# Patient Record
Sex: Male | Born: 1945
Health system: Southern US, Community
[De-identification: ages and names within clinical notes are randomized; demographics above are authoritative.]

## PROBLEM LIST (undated history)

## (undated) DIAGNOSIS — E785 Hyperlipidemia, unspecified: Secondary | ICD-10-CM

## (undated) DIAGNOSIS — E119 Type 2 diabetes mellitus without complications: Secondary | ICD-10-CM

## (undated) DIAGNOSIS — K649 Unspecified hemorrhoids: Secondary | ICD-10-CM

## (undated) HISTORY — DX: Hyperlipidemia, unspecified: E78.5

## (undated) HISTORY — PX: STOMACH SURGERY: SHX791

## (undated) HISTORY — DX: Type 2 diabetes mellitus without complications: E11.9

## (undated) HISTORY — DX: Unspecified hemorrhoids: K64.9

---

## 2012-11-13 ENCOUNTER — Encounter: Payer: Self-pay | Admitting: General Practice

## 2012-11-13 ENCOUNTER — Ambulatory Visit (INDEPENDENT_AMBULATORY_CARE_PROVIDER_SITE_OTHER): Payer: 59 | Admitting: General Practice

## 2012-11-13 ENCOUNTER — Encounter (INDEPENDENT_AMBULATORY_CARE_PROVIDER_SITE_OTHER): Payer: Self-pay

## 2012-11-13 VITALS — BP 112/72 | HR 73 | Temp 98.7°F | Ht 63.0 in | Wt 180.0 lb

## 2012-11-13 DIAGNOSIS — S99922A Unspecified injury of left foot, initial encounter: Secondary | ICD-10-CM

## 2012-11-13 DIAGNOSIS — Z23 Encounter for immunization: Secondary | ICD-10-CM

## 2012-11-13 DIAGNOSIS — S8990XA Unspecified injury of unspecified lower leg, initial encounter: Secondary | ICD-10-CM

## 2012-11-13 MED ORDER — AMOXICILLIN 500 MG PO CAPS: 500.0000 mg | ORAL_CAPSULE | Freq: Two times a day (BID) | ORAL | Status: DC

## 2012-11-13 NOTE — Patient Instructions (Signed)
Tetanus and Diphtheria Vaccine Your caregiver has suggested that you receive an immunization to prevent tetanus (lockjaw) and diphtheria. Tetanus and diphtheria are serious and deadly infectious diseases of the past that have been nearly wiped out by modern immunizations. Td or DT vaccines (shots) are the immunizations given to help prevent these illnesses. Td is the medical term for a standard tetanus dose, small diphtheria dose. DT means both in standard doses. ABOUT THE DISEASES Tetanus (lockjaw) and diphtheria are serious diseases. Tetanus is caused by a germ that lives in the soil. It enters the body through a cut or wound, often caused by a nail or broken piece of glass. You cannot catch tetanus from another person. Diphtheria spreads when germs pass from an infected person to the nose or throat of others. Tetanus causes serious, painful spasms of all muscles. It can lead to:  "Locking" of the muscles of the jaw and throat, so the patient cannot open his or her mouth or swallow.  Damage to the heart muscle. Diphtheria causes a thick coating in the nose, throat, or airway. It can lead to:  Breathing problems.  Kidney problems.  Heart failure.  Paralysis.  Death. ABOUT THE VACCINES  A vaccine is a shot (immunization) that can help prevent a disease. Vaccines have helped lower the rates of getting certain diseases. If people stopped getting vaccinated, more people would develop illnesses. These vaccines can be used in three ways:  As catch-up for people who did not get all their doses when they were children.  As a booster dose every 10 years.  For protection against tetanus infection, after a wound. Benefits of the vaccines Vaccination is the best way to protect against tetanus and diphtheria. Because of vaccination, there are fewer cases of these diseases. Cases are rare in children because most get a routine vaccination with DTP (Diphtheria, Tetanus, and Pertussis), DTaP  (Diphtheria, Tetanus, and acellular Pertussis), or DT (Diphtheria and Tetanus) vaccines. There would be many more cases if we stopped vaccinating people. Tetanus kills about 1 in 5 people who are infected. WHEN SHOULD YOU GET TD VACCINE?  Td is made for people 7 years of age and older.  People who have not gotten at least 3 doses of any tetanus and diphtheria vaccine (DTP, DTaP or DT) during their lifetime should do so using Td. After a person gets the third dose, a Td dose is needed every 10 years all through life. This is because protection fades over time. Booster shots are needed every 10 years.  Other vaccines may be given at the same time as Td. You may not know today whether your immunizations are current. The vaccine given today is to protect you from your next cut or injury. It does not offer protection for the current injury. An immune globulin injection may be given, if protection is needed immediately. Check with your caregiver later regarding your immunization status. Tell your caregiver if the person getting the vaccine:  Has ever had a serious allergic reaction or other problem with Td, or any other tetanus and diphtheria vaccine (DTP, DTaP, or DT). People who have had a serious allergic reaction should not receive the vaccine.  Has epilepsy or another nervous system illness.  Has had Guillain Barre Syndrome (GBS) in the past.  Now has a moderate or severe illness.  Is pregnant.  If you are not sure, ask your caregiver. WHAT ARE THE RISKS FROM TD VACCINE?  As with any medicine, there are very small   risks that serious problems, even death, could occur after getting a vaccine. However, the risk of a serious side effect from the vaccine is almost zero.  The risks from the vaccine are much smaller than the risks from the diseases, if people stopped getting vaccinated. Both diseases can cause serious health problems, which are prevented by the vaccine.  Almost all people who get  Td have no problems from it. Mild problems If mild problems occur, they usually start within hours to a day or two after vaccination. They may last 1-2 days:  Soreness, redness, or swelling where the shot was given.  Headache or tiredness.  Occasionally, a low grade fever. These problems can be worse in adults who get Td vaccine very often. Non-aspirin medicines may be used to reduce soreness. Severe problems These problems happen very rarely:  Serious allergic reaction (at most, occurs in 1 in 1 million vaccinated persons). This occurs almost immediately, and is treatable with medicines. Signs of a serious allergic reaction include:  Difficulty breathing.  Hoarseness or wheezing.  Hives.  Dizziness.  Deep, aching pain and muscle wasting in upper arm(s). Overall, the benefits to you and your family from these vaccines are far greater than the risk. WHAT TO DO IF THERE IS A SERIOUS REACTION:  Call a caregiver or get the person to a doctor or emergency room right away.  Write down what happened, the date and time it happened, and tell your caregiver.  Ask your caregiver to file a Vaccine Adverse Event Report form or call, toll-free: [phone] If you want to learn more about this vaccine, ask your caregiver. She/he can give you the vaccine package insert or suggest other sources of information. Also, the National Vaccine Injury Compensation Program gives compensation (payment) for persons thought to be injured by vaccines. For details call, toll-free: (800) 338-2382. Document Released: 01/15/2000 Document Revised: 04/11/2011 Document Reviewed: 12/04/2008 Exit[employer] Patient Information 2014 [employer].  

## 2012-11-13 NOTE — Progress Notes (Signed)
  Subjective:    Patient ID: Tom [name], male    DOB: Jan 27, 1946, 67 y.o.   MRN: [id number]  HPI Patient (spanish speaking) presents today accompanied by a friend/translator. Patient reports stepping on a nail yesterday that went through his shoe. Reports slight tenderness, no drainage, redness. Denies taking or applying OTC medications.     Review of Systems  Constitutional: Negative for fever and chills.  Respiratory: Negative for chest tightness and shortness of breath.   Cardiovascular: Negative for chest pain and palpitations.  Neurological: Negative for dizziness, weakness and headaches.       Objective:   Physical Exam  Constitutional: He is oriented to person, place, and time. He appears well-developed and well-nourished.  Cardiovascular: Normal rate, regular rhythm and normal heart sounds.   Pulmonary/Chest: Effort normal and breath sounds normal.  Musculoskeletal: He exhibits tenderness. He exhibits no edema.  Slight tenderness noted to ball of left foot with palpation. Negative erythema, edema, drainage, or warmth.  Neurological: He is alert and oriented to person, place, and time.  Skin: Skin is warm and dry.  Psychiatric: He has a normal mood and affect.          Assessment & Plan:  1. Injury of left foot, initial encounter  - Tdap vaccine greater than or equal to 7yo IM - amoxicillin (AMOXIL) 500 MG capsule; Take 1 capsule (500 mg total) by mouth 2 (two) times daily.  Dispense: 20 capsule; Refill: 0 -take medications as prescribed -RTO if symptoms develop  -Patient and translator verbalized understanding Coralie Keens, FNP-C

## 2013-04-01 ENCOUNTER — Encounter: Payer: Self-pay | Admitting: Internal Medicine

## 2013-05-13 ENCOUNTER — Ambulatory Visit: Payer: Self-pay | Admitting: Internal Medicine

## 2013-05-16 ENCOUNTER — Encounter: Payer: Self-pay | Admitting: Internal Medicine

## 2013-05-21 ENCOUNTER — Encounter: Payer: Self-pay | Admitting: Internal Medicine

## 2013-05-21 ENCOUNTER — Ambulatory Visit (INDEPENDENT_AMBULATORY_CARE_PROVIDER_SITE_OTHER): Payer: 59 | Admitting: Internal Medicine

## 2013-05-21 VITALS — BP 152/78 | HR 82 | Ht 66.0 in | Wt 182.0 lb

## 2013-05-21 DIAGNOSIS — K644 Residual hemorrhoidal skin tags: Secondary | ICD-10-CM

## 2013-05-21 DIAGNOSIS — Z1211 Encounter for screening for malignant neoplasm of colon: Secondary | ICD-10-CM

## 2013-05-21 DIAGNOSIS — Z800 Family history of malignant neoplasm of digestive organs: Secondary | ICD-10-CM

## 2013-05-21 MED ORDER — MOVIPREP 100 G PO SOLR: ORAL | Status: DC
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
## 2013-05-21 NOTE — Patient Instructions (Signed)
You have been scheduled for a Endoscopycolonoscopy with propofol. Please follow written instructions given to you at your visit today.  Please pick up your prep kit at the pharmacy within the next 1-3 days. If you use inhalers (even only as needed), please bring them with you on the day of your procedure. Your physician has requested that you go to www.startemmi.com and enter the access code given to you at your visit today. This web site gives a general overview about your procedure. However, you should still follow specific instructions given to you by our office regarding your preparation for the procedure.   Remember to call us back to reschedule your procedure if this date does not work for you

## 2013-06-04 ENCOUNTER — Encounter: Payer: Self-pay | Admitting: Internal Medicine

## 2013-07-18 ENCOUNTER — Encounter: Payer: 59 | Admitting: Internal Medicine

## 2013-08-21 ENCOUNTER — Ambulatory Visit (AMBULATORY_SURGERY_CENTER): Payer: 59 | Admitting: Internal Medicine

## 2013-08-21 VITALS — BP 121/77 | HR 66 | Temp 97.1°F | Resp 14 | Ht 66.0 in | Wt 182.0 lb

## 2013-08-21 DIAGNOSIS — Z1211 Encounter for screening for malignant neoplasm of colon: Secondary | ICD-10-CM

## 2013-08-21 DIAGNOSIS — K2990 Gastroduodenitis, unspecified, without bleeding: Secondary | ICD-10-CM

## 2013-08-21 DIAGNOSIS — K2970 Gastritis, unspecified, without bleeding: Secondary | ICD-10-CM

## 2013-08-21 DIAGNOSIS — Z800 Family history of malignant neoplasm of digestive organs: Secondary | ICD-10-CM

## 2013-08-21 DIAGNOSIS — D126 Benign neoplasm of colon, unspecified: Secondary | ICD-10-CM

## 2013-08-21 MED ORDER — SODIUM CHLORIDE 0.9 % IV SOLN: 500.0000 mL | INTRAVENOUS | Status: DC

## 2013-08-21 NOTE — Patient Instructions (Signed)
YOU HAD AN ENDOSCOPIC PROCEDURE TODAY AT THE [facility]: Refer to the procedure report that was given to you for any specific questions about what was found during the examination.  If the procedure report does not answer your questions, please call your gastroenterologist to clarify.  If you requested that your care partner not be given the details of your procedure findings, then the procedure report has been included in a sealed envelope for you to review at your convenience later.  YOU SHOULD EXPECT: Some feelings of bloating in the abdomen. Passage of more gas than usual.  Walking can help get rid of the air that was put into your GI tract during the procedure and reduce the bloating. If you had a lower endoscopy (such as a colonoscopy or flexible sigmoidoscopy) you may notice spotting of blood in your stool or on the toilet paper. If you underwent a bowel prep for your procedure, then you may not have a normal bowel movement for a few days.  DIET: Your first meal following the procedure should be a light meal and then it is ok to progress to your normal diet.  A half-sandwich or bowl of soup is an example of a good first meal.  Heavy or fried foods are harder to digest and may make you feel nauseous or bloated.  Likewise meals heavy in dairy and vegetables can cause extra gas to form and this can also increase the bloating.  Drink plenty of fluids but you should avoid alcoholic beverages for 24 hours.  ACTIVITY: Your care partner should take you home directly after the procedure.  You should plan to take it easy, moving slowly for the rest of the day.  You can resume normal activity the day after the procedure however you should NOT DRIVE or use heavy machinery for 24 hours (because of the sedation medicines used during the test).    SYMPTOMS TO REPORT IMMEDIATELY: A gastroenterologist can be reached at any hour.  During normal business hours, 8:30 AM to 5:00 PM Monday through Friday,  call [phone].  After hours and on weekends, please call the GI answering service at [phone] who will take a message and have the physician on call contact you.   Following lower endoscopy (colonoscopy or flexible sigmoidoscopy):  Excessive amounts of blood in the stool  Significant tenderness or worsening of abdominal pains  Swelling of the abdomen that is new, acute  Fever of 100F or higher  Following upper endoscopy (EGD)  Vomiting of blood or coffee ground material  New chest pain or pain under the shoulder blades  Painful or persistently difficult swallowing  New shortness of breath  Fever of 100F or higher  Black, tarry-looking stools  FOLLOW UP: If any biopsies were taken you will be contacted by phone or by letter within the next 1-3 weeks.  Call your gastroenterologist if you have not heard about the biopsies in 3 weeks.  Our staff will call the home number listed on your records the next business day following your procedure to check on you and address any questions or concerns that you may have at that time regarding the information given to you following your procedure. This is a courtesy call and so if there is no answer at the home number and we have not heard from you through the emergency physician on call, we will assume that you have returned to your regular daily activities without incident.  SIGNATURES/CONFIDENTIALITY: You and/or your care   partner have signed paperwork which will be entered into your electronic medical record.  These signatures attest to the fact that that the information above on your After Visit Summary has been reviewed and is understood.  Full responsibility of the confidentiality of this discharge information lies with you and/or your care-partner.   Information on gastritis given to you today  Await biopsy results  Information on polyps given to you today   Probiotic as needed as Dr Hilarie Fredrickson discussed with you and wrote on the  back of your report

## 2013-08-21 NOTE — Op Note (Signed)
[place]  [address] [place], [id number]   COLONOSCOPY PROCEDURE REPORT  PATIENT: Tom [name], Tom [name]  MR#: [id number] BIRTHDATE: 07-Jun-1945 , 67  yrs. old GENDER: Male ENDOSCOPIST: Jerene Bears, MD REFERRED EN:MMHWKGS Jacelyn Grip, M.D. PROCEDURE DATE:  08/21/2013 PROCEDURE:   Colonoscopy with cold biopsy polypectomy First Screening Colonoscopy - Avg.  risk and is 50 yrs.  old or older Yes.  Prior Negative Screening - Now for repeat screening. N/A  History of Adenoma - Now for follow-up colonoscopy & has been > or = to 3 yrs.  N/A  Polyps Removed Today? Yes. ASA CLASS:   Class II INDICATIONS:average risk screening and first colonoscopy. MEDICATIONS: MAC sedation, administered by CRNA, propofol (Diprivan) 70mg  IV, and There was residual sedation effect present from prior procedure.  DESCRIPTION OF PROCEDURE:   After the risks benefits and alternatives of the procedure were thoroughly explained, informed consent was obtained.  A digital rectal exam revealed no rectal mass.   The LB [id number]  endoscope was introduced through the anus and advanced to the cecum, which was identified by both the appendix and ileocecal valve. No adverse events experienced. The quality of the prep was good, using MoviPrep  The instrument was then slowly withdrawn as the colon was fully examined.  COLON FINDINGS: A diminutive sessile polyp was found in the sigmoid colon.  A polypectomy was performed with cold forceps.  The resection was complete and the polyp tissue was completely retrieved.   The colon was otherwise normal.  There was no diverticulosis, inflammation, or cancers seen.  Retroflexed views revealed no abnormalities. The time to cecum=2 minutes 01 seconds. Withdrawal time=9 minutes 16 seconds.  The scope was withdrawn and the procedure completed. COMPLICATIONS: There were no complications.  ENDOSCOPIC IMPRESSION: 1.   Diminutive sessile polyp was found in the sigmoid  colon; polypectomy was performed with cold forceps 2.   The colon was otherwise normal  RECOMMENDATIONS: 1.  Await pathology results 2.  If the polyp removed today is proven to be an adenomatous (pre-cancerous) polyp, you will need a repeat colonoscopy in 5 years.  Otherwise you should continue to follow colorectal cancer screening guidelines for "routine risk" patients with colonoscopy in 10 years.  You will receive a letter within 1-2 weeks with the results of your biopsy as well as final recommendations.  Please call my office if you have not received a letter after 3 weeks.   eSigned:  Jerene Bears, MD 08/21/2013 8:55 AM  cc: The Patient and Ulanda Edison MD

## 2013-08-21 NOTE — Progress Notes (Signed)
Called to room to assist during endoscopic procedure.  Patient ID and intended procedure confirmed with present staff. Received instructions for my participation in the procedure from the performing physician.  

## 2013-08-21 NOTE — Progress Notes (Signed)
A/ox3, pleased with MAC, report to RN 

## 2013-08-21 NOTE — Op Note (Signed)
[place]  [address]. [place], [id number]   ENDOSCOPY PROCEDURE REPORT  PATIENT: Tom [name], Tom [name]  MR#: [id number] BIRTHDATE: [birth year] , 44  yrs. old GENDER: Male ENDOSCOPIST: Jerene Bears, MD REFERRED BY:  Yaakov Guthrie, M.D. PROCEDURE DATE:  08/21/2013 PROCEDURE:  EGD w/ biopsy for H.pylori ASA CLASS:     Class II INDICATIONS:  Family history of gastric cancer. MEDICATIONS: MAC sedation, administered by CRNA, Propofol (Diprivan), and propofol (Diprivan) 200mg  IV TOPICAL ANESTHETIC: none  DESCRIPTION OF PROCEDURE: After the risks benefits and alternatives of the procedure were thoroughly explained, informed consent was obtained.  The LB [id number] endoscope was introduced through the mouth and advanced to the second portion of the duodenum. Without limitations.  The instrument was slowly withdrawn as the mucosa was fully examined.   ESOPHAGUS: The mucosa of the esophagus appeared normal.  Regular Z-line at 40 cm from the incisors.  STOMACH: Mild acute gastritis (inflammation) was found in the gastric antrum.  There were scattered erosions present.  Multiple biopsies were taken in the gastric body, antrum, and incisura to rule out H. pylori.   The stomach otherwise appeared normal.  DUODENUM: The duodenal mucosa showed no abnormalities in the bulb and second portion of the duodenum.  Retroflexed views revealed no abnormalities.     The scope was then withdrawn from the patient and the procedure completed.  COMPLICATIONS: There were no complications.  ENDOSCOPIC IMPRESSION: 1.   The mucosa of the esophagus appeared normal 2.  Mild gastritis (inflammation) was found in the gastric antrum; multiple biopsies were taken from the gastric body, antrum and incisura to rule out H. pylori 3.   The stomach otherwise appeared normal 4.   The duodenal mucosa showed no abnormalities in the bulb and second portion of the duodenum  RECOMMENDATIONS: 1.   Await biopsy results 2.  Follow-up of helicobacter pylori status, treat if indicated  eSigned:  Jerene Bears, MD 08/21/2013 8:52 AM  CC:The Patient and Ulanda Edison MD

## 2013-08-22 ENCOUNTER — Telehealth: Payer: Self-pay | Admitting: *Deleted

## 2013-08-22 NOTE — Telephone Encounter (Signed)
  Follow up Call-  Call back number 08/21/2013  Post procedure Call Back phone  # [phone]  Permission to leave phone message Yes     Patient questions:  Do you have a fever, pain , or abdominal swelling? No. Pain Score  0 *  Have you tolerated food without any problems? Yes.    Have you been able to return to your normal activities? Yes.    Do you have any questions about your discharge instructions: Diet   No. Medications  No. Follow up visit  No.  Do you have questions or concerns about your Care? No.  Actions: * If pain score is 4 or above: No action needed, pain <4.  Pt. Gone to work. Information provided via care partner.

## 2013-09-02 ENCOUNTER — Encounter: Payer: Self-pay | Admitting: Internal Medicine

## 2014-11-06 ENCOUNTER — Ambulatory Visit (INDEPENDENT_AMBULATORY_CARE_PROVIDER_SITE_OTHER): Payer: Managed Care, Other (non HMO) | Admitting: *Deleted

## 2014-11-06 DIAGNOSIS — Z23 Encounter for immunization: Secondary | ICD-10-CM | POA: Diagnosis not present

## 2015-11-03 ENCOUNTER — Ambulatory Visit (INDEPENDENT_AMBULATORY_CARE_PROVIDER_SITE_OTHER): Payer: Managed Care, Other (non HMO)

## 2015-11-03 DIAGNOSIS — Z23 Encounter for immunization: Secondary | ICD-10-CM | POA: Diagnosis not present

## 2016-04-18 ENCOUNTER — Ambulatory Visit: Payer: Managed Care, Other (non HMO)

## 2016-05-30 ENCOUNTER — Encounter: Payer: Self-pay | Admitting: *Deleted

## 2016-06-24 ENCOUNTER — Encounter: Payer: Self-pay | Admitting: Internal Medicine

## 2016-09-14 NOTE — Progress Notes (Deleted)
   Subjective: OT:RRNHAFBXU care, knee pain HPI: Tom [name] is a 71 y.o. male presenting to clinic today for:  [employer] video interpreter ***, ID *** used for Spanish translation of this visit  1. Knee pain: ***   Social Hx: *** smoker.  Family History  Problem Relation Age of Onset  . Cancer Mother   . Heart Problems Father   . Stomach cancer Brother    Past Medical History:  Diagnosis Date  . Hemorrhoids    No Known Allergies Medications reviewed.   Health Maintenance: ***  Flu Vaccine: {YES/NO/WILD XYBFX:83291}  Tdap Vaccine: {YES/NO/WILD BTYOM:60045}  - every 25yrs - (<3 lifetime doses or unknown): all wounds -- look up need for Tetanus IG - (>=3 lifetime doses): clean/minor wound if >36yrs from previous; all other wounds if >33yrs from previous Zoster Vaccine: {YES/NO/WILD CARDS:18581} (those >50yo, once) Pneumonia Vaccine: {YES/NO/WILD TXHFS:14239} (those w/ risk factors) - (<63yr) Both: Immunocompromised, cochlear implant, CSF leak, asplenic, sickle cell, Chronic Renal Failure - (<3yr) PPSV-23 only: Heart dz, lung disease, DM, tobacco abuse, alcoholism, cirrhosis/liver disease. - (>62yr): PPSV13 then PPSV23 in 6-12mths;  - (>8yr): repeat PPSV23 once if pt received prior to 71yo and 53yrs have passed  ROS: Per HPI  Objective: Office vital signs reviewed. There were no vitals taken for this visit.  Physical Examination:  General: Awake, alert, *** nourished, No acute distress HEENT: Normal    Neck: No masses palpated. No lymphadenopathy    Ears: Tympanic membranes intact, normal light reflex, no erythema, no bulging    Eyes: PERRLA, extraocular movement in tact, sclera ***    Nose: nasal turbinates moist, *** nasal discharge    Throat: moist mucus membranes, no erythema, *** tonsillar exudate.  Airway is patent Cardio: regular rate and rhythm, S1S2 heard, no murmurs appreciated Pulm: clear to auscultation bilaterally, no wheezes, rhonchi or rales;  normal work of breathing on room air GI: soft, non-tender, non-distended, bowel sounds present x4, no hepatomegaly, no splenomegaly, no masses GU: external vaginal tissue ***, cervix ***, *** punctate lesions on cervix appreciated, *** discharge from cervical os, *** bleeding, *** cervical motion tenderness, *** abdominal/ adnexal masses Extremities: warm, well perfused, No edema, cyanosis or clubbing; +*** pulses bilaterally MSK: *** gait and *** station Skin: dry; intact; no rashes or lesions Neuro: *** Strength and light touch sensation grossly intact, *** DTRs ***/4  Assessment/ Plan: 71 y.o. male   No problem-specific Assessment & Plan notes found for this encounter.   Janora Norlander, DO [place] [phone]

## 2016-09-16 ENCOUNTER — Ambulatory Visit (INDEPENDENT_AMBULATORY_CARE_PROVIDER_SITE_OTHER): Payer: Managed Care, Other (non HMO) | Admitting: Family Medicine

## 2016-09-16 ENCOUNTER — Ambulatory Visit: Payer: Managed Care, Other (non HMO) | Admitting: Family Medicine

## 2016-09-16 ENCOUNTER — Encounter: Payer: Self-pay | Admitting: Family Medicine

## 2016-09-16 VITALS — BP 126/83 | HR 98 | Temp 97.3°F | Ht 63.0 in | Wt 188.0 lb

## 2016-09-16 DIAGNOSIS — M25562 Pain in left knee: Secondary | ICD-10-CM | POA: Diagnosis not present

## 2016-09-16 DIAGNOSIS — Z603 Acculturation difficulty: Secondary | ICD-10-CM | POA: Insufficient documentation

## 2016-09-16 DIAGNOSIS — G8929 Other chronic pain: Secondary | ICD-10-CM | POA: Insufficient documentation

## 2016-09-16 DIAGNOSIS — Z789 Other specified health status: Secondary | ICD-10-CM

## 2016-09-16 MED ORDER — METHYLPREDNISOLONE ACETATE 80 MG/ML IJ SUSP
80.0000 mg | Freq: Once | INTRAMUSCULAR | Status: AC
  Administered 2016-09-16: 80 mg via INTRAMUSCULAR

## 2016-09-16 NOTE — Progress Notes (Deleted)
   Subjective: CC: Establish care, knee pain HPI: Tom [name] is a 71 y.o. male presenting to clinic today for:  1. Knee pain ***   Social Hx: *** smoker.  Family History  Problem Relation Age of Onset  . Cancer Mother   . Heart Problems Father   . Stomach cancer Brother    Past Medical History:  Diagnosis Date  . Hemorrhoids    No Known Allergies Medications reviewed.   Health Maintenance: ***  Flu Vaccine: {YES/NO/WILD HQRFX:58832}  Tdap Vaccine: {YES/NO/WILD PQDIY:64158}  - every 50yrs - (<3 lifetime doses or unknown): all wounds -- look up need for Tetanus IG - (>=3 lifetime doses): clean/minor wound if >85yrs from previous; all other wounds if >46yrs from previous Zoster Vaccine: {YES/NO/WILD CARDS:18581} (those >50yo, once) Pneumonia Vaccine: {YES/NO/WILD XENMM:76808} (those w/ risk factors) - (<79yr) Both: Immunocompromised, cochlear implant, CSF leak, asplenic, sickle cell, Chronic Renal Failure - (<73yr) PPSV-23 only: Heart dz, lung disease, DM, tobacco abuse, alcoholism, cirrhosis/liver disease. - (>72yr): PPSV13 then PPSV23 in 6-12mths;  - (>11yr): repeat PPSV23 once if pt received prior to 71yo and 60yrs have passed  ROS: Per HPI  Objective: Office vital signs reviewed. There were no vitals taken for this visit.  Physical Examination:  General: Awake, alert, *** nourished, No acute distress HEENT: Normal    Neck: No masses palpated. No lymphadenopathy    Ears: Tympanic membranes intact, normal light reflex, no erythema, no bulging    Eyes: PERRLA, extraocular movement in tact, sclera ***    Nose: nasal turbinates moist, *** nasal discharge    Throat: moist mucus membranes, no erythema, *** tonsillar exudate.  Airway is patent Cardio: regular rate and rhythm, S1S2 heard, no murmurs appreciated Pulm: clear to auscultation bilaterally, no wheezes, rhonchi or rales; normal work of breathing on room air GI: soft, non-tender, non-distended, bowel sounds  present x4, no hepatomegaly, no splenomegaly, no masses GU: external vaginal tissue ***, cervix ***, *** punctate lesions on cervix appreciated, *** discharge from cervical os, *** bleeding, *** cervical motion tenderness, *** abdominal/ adnexal masses Extremities: warm, well perfused, No edema, cyanosis or clubbing; +*** pulses bilaterally MSK: *** gait and *** station Skin: dry; intact; no rashes or lesions Neuro: *** Strength and light touch sensation grossly intact, *** DTRs ***/4  Assessment/ Plan: 71 y.o. male   No problem-specific Assessment & Plan notes found for this encounter.   Janora Norlander, DO [place] [phone]

## 2016-09-16 NOTE — Addendum Note (Signed)
Addended byCarrolyn Leigh on: 09/16/2016 04:18 PM   Modules accepted: Orders

## 2016-09-16 NOTE — Assessment & Plan Note (Addendum)
This is likely secondary to osteoarthritis versus degenerative meniscus. Bilateral standing x-rays were considered; however, patient was not looking to establish care at this office. He came thinking that this was an urgent care office and because of uncertainty as to whether or not his insurance would cover the x-rays, he declined these. His exam was notable for valgus abnormality of the lower extremity, medial tenderness to palpation of the knee, and pain with range of motion testing. He also had a noticeable limp. There is nothing on exam to suggest gout flare or septic joint. Given unilateral nature doubt autoimmune etiologies like rheumatoid arthritis, or lupus arthritis.  He was refractory to NSAIDs and glucosamine/chondroitin. For this reason a joint injection was offered. The risks and benefits of the injection was discussed with the patient. Patient elected to proceed with injection. A signed consent was completed and placed in the chart. See procedure note for details. Patient tolerated the injection without difficulty and there were no any immediate complications. I reviewed with him that his blood sugar may rise slightly after the injection. I encouraged him to continue his metformin as directed. I cautioned him that he may feel that his left knee is more stiff and possibly more painful over the next 24 hours. I reiterated that this is a common side effect of intra-articular steroid injection. Patient voiced good understanding. Home care instructions were reviewed. Strict return precautions were discussed including signs and symptoms of infection. He was instructed to continue his NSAID and joint supplement. Advised that he follow-up with his primary care provider for further injections/possible referral to orthopedics.

## 2016-09-16 NOTE — Assessment & Plan Note (Addendum)
[employer] video interpreter [place], [place] used for Spanish translation of this entire visit, including consent of above procedure and discharge instructions.  Patient voiced good understanding.

## 2016-09-16 NOTE — Progress Notes (Signed)
Subjective: CC: knee pain HPI: Tom [name] is a 71 y.o. male presenting to clinic today for:  1. Knee pain Patient presents to our office for acute on chronic left knee pain which he describes as achy. He notes that he has had this pain for several years. However, pain has been worse over the last 8 days. He reports that his discuss this with his primary care provider, who is nurse practitioner [facility] after walking him [facility].  He scheduled this appointment because he thought we'd be able to see him for urgent care matters.  He notes that he plans on continuing to see his PCP for his regular concerns.  He reports that he was started 2 years ago on glucosamine/chondroitin pills as well as naproxen which he takes regularly. He notes that this used to control his pain and stiffness; however, as of late this medication is not helpful. He denies numbness or tingling in the left lower extremity. He notes difficulty with ambulation secondary to pain and stiffness. He reports that pain is worsened in flexion and with lowering his leg from extension. He denies preceding injury. Though notes he did have a skin/soft tissue injury to the medial aspect of his left knee several years ago which required stitches. He denies locking. He reports crepitus.   Patient denies medication allergies, including allergies to antiseptics or anesthetics. He is never seen orthopedics nor had a joint injection in the past. He notes that his diabetes is well controlled on metformin 500 mg daily.  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 He notes he requires frequent ambulation and standing at least 8 hours per day.  Social Hx: works at [employer], former smoker  Family History  Problem Relation Age of Onset  . Cancer Mother   . Heart Problems Father   . Stomach cancer Brother    Past Medical History:  Diagnosis Date  . Diabetes mellitus without complication ([place])   .  Hemorrhoids    No Known Allergies Medications reviewed.    ROS: Per HPI  Objective: Office vital signs reviewed. BP 126/83   Pulse 98   Temp (!) 97.3 F (36.3 C) (Oral)   Ht 5\' 3"  (1.6 m)   Wt 188 lb (85.3 kg)   BMI 33.30 kg/m   Physical Examination:  General: Awake, alert, well nourished, No acute distress Cardio: regular rate, +2 DP  Pulm: normal work of breathing on room air Extremities: Left hand with surgically absent distal ring finger.  Otherwise extremities are warm, well perfused, No edema, cyanosis or clubbing. 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  Right lower extremity: full active, painless range of motion. No erythema, ecchymosis, knee effusion. No tenderness to joint line, patella, patellar tendon, or quad tendon. Crepitus present.  Left lower extremity: patient has full active range of motion. However, he is pain with both extension and flexion of the knee. He exhibits stiffness with range of motion testing. His gait is antalgic with a noticeable limp. He has no overlying erythema, ecchymosis, or joint effusions. He has got palpable crepitus. Joint is stable, with no ligamentous laxity.No palpable masses in the posterior popliteal fossa. He has got mild to moderate tenderness to palpation along the entire medial knee. No patellar tenderness, patellar tendon tenderness, or quads tendon tenderness to palpation. He has pain with [place] on the left. MSK: antalgic gait Skin: dry; intact; no rashes or lesions Neuro: 5/5 right lower extremity and  4/5 left lower extremity Strength (in flexion and extension) and light touch sensation grossly intact bilaterally  INJECTION: Patient was given informed consent, signed copy in the chart. Appropriate time out was taken.  Anatomic landmarks were identified and site of injection was marked.  Area prepped and draped in usual sterile fashion. 1 cc of methylprednisolone 80 mg/ml plus  2 cc of 1% lidocaine without  epinephrine was injected into the left knee using a(n) anterior lateral approach. The patient tolerated the procedure well. There were no immediate complications. Post procedure instructions were reviewed and handout given.  Assessment/ Plan: 71 y.o. male   Chronic pain of left knee This is likely secondary to osteoarthritis versus degenerative meniscus. Bilateral standing x-rays were considered; however, patient was not looking to establish care at this office. He came thinking that this was an urgent care office and because of uncertainty as to whether or not his insurance would cover the x-rays, he declined these. His exam was notable for valgus abnormality of the lower extremity, medial tenderness to palpation of the knee, and pain with range of motion testing. He also had a noticeable limp. There is nothing on exam to suggest gout flare or septic joint. Given unilateral nature doubt autoimmune etiologies like rheumatoid arthritis, or lupus arthritis.  He was refractory to NSAIDs and glucosamine/chondroitin. For this reason a joint injection was offered. The risks and benefits of the injection was discussed with the patient. Patient elected to proceed with injection. A signed consent was completed and placed in the chart. See procedure note for details. Patient tolerated the injection without difficulty and there were no any immediate complications. I reviewed with him that his blood sugar may rise slightly after the injection. I encouraged him to continue his metformin as directed. I cautioned him that he may feel that his left knee is more stiff and possibly more painful over the next 24 hours. I reiterated that this is a common side effect of intra-articular steroid injection. Patient voiced good understanding. Home care instructions were reviewed. Strict return precautions were discussed including signs and symptoms of infection. He was instructed to continue his NSAID and joint supplement. Advised  that he follow-up with his primary care provider for further injections/possible referral to orthopedics.  Language barrier affecting health care Str[employer] video interpreter Marliss Coots, [place] used for Spanish translation of this entire visit, including consent of above procedure and discharge instructions.  Patient voiced good understanding.  Janora Norlander, DO [place] [phone]

## 2016-09-16 NOTE — Patient Instructions (Signed)
Infiltracin de rodilla (Knee Injection) La infiltracin de rodilla es un procedimiento que se realiza para Teacher, music en la articulacin de la rodilla. El mdico introduce una aguja en la articulacin e inyecta un medicamento con Clent Jacks accesoria. El medicamento que se inyecta puede Best boy, la hinchazn y la rigidez de la artritis. Adems, puede ayudar a lubricar y proteger la articulacin de la rodilla. Tal vez deba recibir ms de una inyeccin. INFORME A SU MDICO:  Cualquier alergia que tenga.  Todos los [employer], incluidos vitaminas, hierbas, gotas oftlmicas, cremas y medicamentos de venta libre.  Problemas previos que usted o los [employer] de su familia hayan tenido con el uso de anestsicos.  Enfermedades de la sangre que tenga.  Si tiene cirugas previas.  Si tiene [employer]. RIESGOS Y COMPLICACIONES En general, se trata de un procedimiento seguro. Sin embargo, pueden presentarse problemas, por ejemplo:  Infeccin.  Hemorragia.  Sntomas que empeoran.  Dao en la zona que rodea a la rodilla.  Reaccin alrgica a alguno de los medicamentos.  Reacciones cutneas debido a las inyecciones reiteradas. ANTES DEL PROCEDIMIENTO  Consulte a su mdico si debe cambiar o suspender los medicamentos que toma habitualmente. Esto es muy importante si toma medicamentos para la diabetes o anticoagulantes.  Haga planes para que una persona lo lleve de vuelta a su casa despus del procedimiento. PROCEDIMIENTO  Se sentar o se acostar en una posicin que permita el tratamiento de la rodilla.  Le limpiarn la piel de la rtula con una solucin que destruir las bacterias (antisptico).  Le administrarn un medicamento para adormecer la zona (anestesia local). Puede sentir cierto escozor.  Una vez que la rodilla est anestesiada, le harn una segunda infiltracin. Este es el medicamento. Esta aguja se coloca cuidadosamente entre la  rtula y la rodilla. El medicamento se inyecta en el espacio de la articulacin.  Al final del procedimiento, se retirar la aguja.  Tal vez le coloquen una venda (vendaje) sobre el lugar de la infiltracin. Este procedimiento puede variar segn el mdico y el hospital. DESPUS DEL PROCEDIMIENTO  Es posible que tenga que hacer ejercicios de amplitud de movimiento de la rodilla, para ayudar a que todo el medicamento se distribuya en el espacio de Water engineer.  Le controlarn con frecuencia la presin arterial, la frecuencia cardaca, la frecuencia respiratoria y Retail buyer de oxgeno en la sangre hasta que haya desaparecido el efecto de los medicamentos administrados.  Se lo controlar para garantizar que no tenga una reaccin al [employer] se le inyect. Esta informacin no tiene Marine scientist el consejo del mdico. Asegrese de hacerle al mdico cualquier pregunta que tenga. Document Released: 05/05/2008 Document Revised: 02/07/2014 Document Reviewed: 11/27/2013 Elsevier Interactive Patient Education  2018 [employer].

## 2016-11-23 DIAGNOSIS — Z23 Encounter for immunization: Secondary | ICD-10-CM | POA: Diagnosis not present

## 2017-02-21 ENCOUNTER — Encounter: Payer: Self-pay | Admitting: Cardiovascular Disease

## 2017-02-21 ENCOUNTER — Ambulatory Visit (INDEPENDENT_AMBULATORY_CARE_PROVIDER_SITE_OTHER): Payer: Managed Care, Other (non HMO) | Admitting: Cardiovascular Disease

## 2017-02-21 VITALS — BP 124/76 | HR 78 | Ht 66.0 in | Wt 191.0 lb

## 2017-02-21 DIAGNOSIS — E119 Type 2 diabetes mellitus without complications: Secondary | ICD-10-CM

## 2017-02-21 DIAGNOSIS — R9431 Abnormal electrocardiogram [ECG] [EKG]: Secondary | ICD-10-CM | POA: Diagnosis not present

## 2017-02-21 NOTE — Patient Instructions (Signed)
Your physician recommends that you schedule a follow-up appointment in:  As needed with Dr.Koneswaran       Thank you for choosing [facility] !
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
## 2017-06-29 ENCOUNTER — Ambulatory Visit: Payer: Managed Care, Other (non HMO) | Admitting: Family Medicine

## 2017-06-29 ENCOUNTER — Encounter: Payer: Self-pay | Admitting: Family Medicine

## 2017-06-29 VITALS — BP 112/63 | HR 72 | Temp 97.2°F | Ht 66.0 in | Wt 189.0 lb

## 2017-06-29 DIAGNOSIS — E119 Type 2 diabetes mellitus without complications: Secondary | ICD-10-CM | POA: Diagnosis not present

## 2017-06-29 DIAGNOSIS — Z789 Other specified health status: Secondary | ICD-10-CM | POA: Diagnosis not present

## 2017-06-29 DIAGNOSIS — B351 Tinea unguium: Secondary | ICD-10-CM | POA: Diagnosis not present

## 2017-06-29 LAB — BAYER DCA HB A1C WAIVED: HB A1C: 6.3 % (ref <7.0)

## 2017-06-29 MED ORDER — METFORMIN HCL ER 500 MG PO TB24: 500.0000 mg | ORAL_TABLET | Freq: Every day | ORAL | 12 refills | Status: DC

## 2017-06-29 MED ORDER — CLOTRIMAZOLE 1 % EX CREA: 1.0000 "application " | TOPICAL_CREAM | Freq: Two times a day (BID) | CUTANEOUS | 0 refills | Status: DC

## 2017-06-29 NOTE — Progress Notes (Signed)
Subjective: CC: blood sugar check PCP: Eustaquio Maize, MD XAJ:OINOMV Hetland is a 72 y.o. male presenting to clinic today for:  [employer] video interpreter [place], [place] used for Spanish translation of this visit   1. Type 2 Diabetes:  Patient reports compliance with metformin XR 500 mg daily.  Last eye exam: This year.  He notes he sees Dr. Manuella Ghazi with [facility] and recently had a left cataract removal in May. Last foot exam: Unknown Last A1c: Per cardiology report less than 7 recently Nephropathy screen indicated?:  Yes.  Not on ACE or ARB. Last flu, zoster and/or pneumovax: ? Unsure  Denies fever, chills, dizziness, LOC, polyuria, polydipsia, unintended weight loss/gain, foot ulcerations, numbness or tingling in extremities or chest pain.  2. Fungus Patient reports that he has a fungus on the lower extremities that was previously responsive to topical antifungals.  He was on topical nystatin before and this worked for a while but symptoms have recurred.  He would like to have a new prescription to treat this.  ROS: Per HPI  No Known Allergies Past Medical History:  Diagnosis Date  . Diabetes mellitus without complication ([place])   . Hemorrhoids     Current Outpatient Medications:  .  metFORMIN (GLUCOPHAGE-XR) 500 MG 24 hr tablet, Take 500 mg by mouth daily with breakfast., Disp: , Rfl:  .  naproxen (NAPROSYN) 500 MG tablet, Take 500 mg by mouth 2 (two) times daily as needed., Disp: , Rfl:  .  pantoprazole (PROTONIX) 40 MG tablet, , Disp: , Rfl:    Social History   Socioeconomic History  . Marital status: Unknown    Spouse name: Not on file  . Number of children: Not on file  . Years of education: Not on file  . Highest education level: Not on file  Occupational History  . Not on file  Social Needs  . Financial resource strain: Not on file  . Food insecurity:    Worry: Not on file    Inability: Not on file  . Transportation needs:    Medical: Not on file      Non-medical: Not on file  Tobacco Use  . Smoking status: Former Research scientist (life sciences)  . Smokeless tobacco: Never Used  Substance and Sexual Activity  . Alcohol use: No  . Drug use: No  . Sexual activity: Not on file  Lifestyle  . Physical activity:    Days per week: Not on file    Minutes per session: Not on file  . Stress: Not on file  Relationships  . Social connections:    Talks on phone: Not on file    Gets together: Not on file    Attends religious service: Not on file    Active member of club or organization: Not on file    Attends meetings of clubs or organizations: Not on file    Relationship status: Not on file  . Intimate partner violence:    Fear of current or ex partner: Not on file    Emotionally abused: Not on file    Physically abused: Not on file    Forced sexual activity: Not on file  Other Topics Concern  . Not on file  Social History Narrative  . Not on file   Family History  Problem Relation Age of Onset  . Cancer Mother   . Heart Problems Father   . Stomach cancer Brother     Objective: Office vital signs reviewed. BP 112/63  Pulse 72   Temp (!) 97.2 F (36.2 C) (Oral)   Ht 5\' 6"  (1.676 m)   Wt 189 lb (85.7 kg)   BMI 30.51 kg/m   Physical Examination:  General: Awake, alert, well nourished, No acute distress HEENT: Normal    Eyes: PERRLA, extraocular membranes intact, sclera white    Throat: moist mucus membranes Cardio: regular rate and rhythm, S1S2 heard, no murmurs appreciated Pulm: clear to auscultation bilaterally, no wheezes, rhonchi or rales; normal work of breathing on room air Extremities: warm, well perfused, No edema, cyanosis or clubbing; +2 pulses bilaterally MSK: normal gait and normal station Skin: dry; mild fungal rash appreciated along the lower extremities.  Onychomycosis appreciated within several toenails of bilateral feet.   Neuro: Normal monofilament exam.  See diabetic foot exam as below.  Diabetic Foot Form - Detailed    Diabetic Foot Exam - detailed Diabetic Foot exam was performed with the following findings:  Yes 06/29/2017  2:25 PM  Visual Foot Exam completed.:  Yes  Can the patient see the bottom of their feet?:  Yes Are the shoes appropriate in style and fit?:  No Is there swelling or and abnormal foot shape?:  No Is there a claw toe deformity?:  No Is there elevated skin temparature?:  No Is there foot or ankle muscle weakness?:  No Normal Range of Motion:  Yes Pulse Foot Exam completed.:  Yes  Right posterior Tibialias:  Present Left posterior Tibialias:  Present  Right Dorsalis Pedis:  Present Left Dorsalis Pedis:  Present  Sensory Foot Exam Completed.:  Yes Semmes-Weinstein Monofilament Test R Site 1-Great Toe:  Pos L Site 1-Great Toe:  Pos       Assessment/ Plan: 72 y.o. male   1. Diabetes mellitus without complication (HCC) K7Q at goal at 6.3 today.  Check urine microalbumin.  No records from previous PCP.  We will plan to check CMP, lipid, hepatitis C screen and A1c at next visit in 6 months.  We will also obtain diabetic eye exam results from ophthalmologist in [place].  Release of information form was completed for this.  We will need to check the database to see if he had his pneumococcal vaccines if not, will need to update this at next visit.  Monofilament test performed today and was within normal limits.  No evidence of diabetic neuropathy.  Referral placed to podiatry for thickened, onychomycotic toenails. - Hemoglobin A1c - Microalbumin / creatinine urine ratio - Ambulatory referral to Podiatry - metFORMIN (GLUCOPHAGE-XR) 500 MG 24 hr tablet; Take 1 tablet (500 mg total) by mouth daily with breakfast.  Dispense: 30 tablet; Refill: 12  2. Language barrier affecting health care Interpreter as above  3. Onychomycosis of toenail - Ambulatory referral to Podiatry   Orders Placed This Encounter  Procedures  . Hemoglobin A1c  . Microalbumin / creatinine urine ratio  .  Ambulatory referral to Podiatry    Referral Priority:   Routine    Referral Type:   Consultation    Referral Reason:   Specialty Services Required    Requested Specialty:   Podiatry    Number of Visits Requested:   1   Meds ordered this encounter  Medications  . metFORMIN (GLUCOPHAGE-XR) 500 MG 24 hr tablet    Sig: Take 1 tablet (500 mg total) by mouth daily with breakfast.    Dispense:  30 tablet    Refill:  12  . clotrimazole (LOTRIMIN) 1 % cream    Sig: Apply  1 application topically 2 (two) times daily.    Dispense:  30 g    Refill:  [place], DO [facility] Negron Torres [phone]

## 2017-06-29 NOTE — Addendum Note (Signed)
Addended by: Janora Norlander on: 06/29/2017 03:56 PM   Modules accepted: Orders

## 2017-06-29 NOTE — Patient Instructions (Signed)
Programe una cita de seguimiento de 6 meses para su diabetes. Te he referido a podologa para tus pies. He rellenado sus medicamentos.  Diabetes y cuidados del pie (Diabetes and Foot Care) La diabetes puede ser la causa de que el flujo sanguneo (circulacin) en las piernas y los pies sea deficiente. Debido a esto, la piel de los pies se torna ms delgada, se rompe con facilidad y se cura ms lentamente. La piel puede estar seca, despellejarse y Medical illustrator. Tambin pueden estar daados los nervios de las piernas y de los pies lo que provoca una disminucin de la sensibilidad. Es posible que no advierta heridas ms pequeas en los pies, que pueden causar infecciones graves. Cuidar sus pies es una de las cosas ms importantes que puede hacer por usted mismo. INSTRUCCIONES PARA EL CUIDADO EN EL HOGAR  Use siempre calzado, an dentro de su casa. No camine descalzo. Caminar descalzo facilita que se lastime.  Controle sus pies diariamente para observar ampollas, cortes y enrojecimiento. Si no puede ver la planta del pie, use un espejo o pdale ayuda a Nurse, children's.  Lave sus pies con agua tibia (no use agua caliente) y un [place]. Seque bien sus pies, y la zona [employer] dedos dando [place], hasta que estn completamente secos. Noremoje los pies, ya que esto puede resecar la piel.  Aplique una locin hidratante o vaselina (que no contenga alcohol ni perfume) en los pies y en las uas secas y [place]. No aplique locin entre los dedos.  Recorte las uas en forma recta. No escarbe debajo de las uas o alrededor [employer]. Lime los bordes de las uas con una lima o esmeril.  No corte las durezas o callosidades, ni trate de quitarlas con medicamentos.  Use calcetines de algodn o medias [employer]. Asegrese de que no le [employer]. Nouse calcetines que le lleguen a las rodillas, ya que podran disminuir el flujo de sangre a las piernas.  Use zapatos de cuero que le  queden bien y que sean acolchados. Para amoldar los zapatos, clcelos slo algunas horas por da. Esto evitar lesiones en los pies. Revise siempre los zapatos antes de ponerlos para asegurarse de que no haya objetos en su interior.  No cruce las piernas. Esto puede disminuir el flujo de sangre a los pies.  Si algo le ha raspado, cortado o lastimado la piel de los pies, mantenga la piel de esa zona limpia y [place]. Debe higienizar estas zonas con agua y un jabn suave. No limpie la zona con agua oxigenada, alcohol ni yodo.  Cuando se quite un vendaje adhesivo, asegrese de no daar la piel.  Si tiene una herida, obsrvela varias veces por da para asegurarse de que se est curando.  No use bolsas de agua caliente ni almohadillas trmicas. Podran causar quemaduras. Si ha perdido la sensibilidad en los pies o las piernas, no sabr lo que le est sucediendo hasta que sea demasiado tarde.  Asegrese de que su mdico le haga un examen completo de los pies por lo menos una vez al ao, o con ms frecuencia si usted tiene [employer]. Informe todos los cortes, llagas o moretones a su mdico inmediatamente.  SOLICITE ATENCIN MDICA SI:  Tiene una lesin que no se cura.  Tiene cortes o rajaduras en la piel.  Tiene una ua encarnada.  Nota una zona irritada en las piernas o los pies.  Siente una sensacin de ardor u hormigueo en las piernas  o los pies.  Siente dolor o calambres en las piernas o los pies.  Las piernas o los pies estn adormecidos.  Siente los pies siempre fros.  SOLICITE ATENCIN MDICA DE INMEDIATO SI:  Presenta enrojecimiento, hinchazn o aumento del dolor en una herida.  Nota una lnea roja que sube por pierna.  Aparece pus en la herida.  Le sube la fiebre o segn lo que le indique el mdico.  Advierte un olor ftido que proviene de una lcera o una herida.  Esta informacin no tiene Marine scientist el consejo del mdico. Asegrese de hacerle al  mdico cualquier pregunta que tenga. Document Released: 01/17/2005 Document Revised: 05/11/2015 Document Reviewed: 06/26/2012 Elsevier Interactive Patient Education  2017 [employer].

## 2017-06-30 LAB — MICROALBUMIN / CREATININE URINE RATIO
Creatinine, Urine: 100.9 mg/dL
MICROALB/CREAT RATIO: 61 mg/g{creat} — AB (ref 0.0–30.0)
MICROALBUM., U, RANDOM: 61.5 ug/mL

## 2017-07-12 ENCOUNTER — Encounter: Payer: Self-pay | Admitting: *Deleted

## 2017-07-27 ENCOUNTER — Ambulatory Visit: Payer: Managed Care, Other (non HMO) | Admitting: Family Medicine

## 2017-07-27 ENCOUNTER — Encounter: Payer: Self-pay | Admitting: Family Medicine

## 2017-07-27 VITALS — BP 138/81 | HR 67 | Temp 97.2°F | Ht 66.0 in | Wt 187.4 lb

## 2017-07-27 DIAGNOSIS — J3489 Other specified disorders of nose and nasal sinuses: Secondary | ICD-10-CM | POA: Diagnosis not present

## 2017-07-27 DIAGNOSIS — R519 Headache, unspecified: Secondary | ICD-10-CM

## 2017-07-27 DIAGNOSIS — R51 Headache: Secondary | ICD-10-CM

## 2017-07-27 MED ORDER — PREDNISONE 20 MG PO TABS: ORAL_TABLET | ORAL | 0 refills | Status: DC

## 2017-07-27 MED ORDER — AMOXICILLIN-POT CLAVULANATE 875-125 MG PO TABS: 1.0000 | ORAL_TABLET | Freq: Two times a day (BID) | ORAL | 0 refills | Status: DC

## 2017-07-27 NOTE — Patient Instructions (Signed)
Great to meet you!  Prednisone and Augmentin today.  Prednisone is a steroid that reduces inflammation, this is to protect you from a possible dangerous headache.  Her lab test today will help rule that type of headache out.  Augmentin is to treat your sinuses, you possibly have a sinus infection.

## 2017-07-27 NOTE — Progress Notes (Signed)
   HPI  Patient presents today with headache.  His friend has translated for him today, he was offered video interpreter which he declined.  Here with 3 to 4 days of headache.  Patient explains he has had a right sided temporal headache that radiates to the right eye.  He denies any vision changes. He also has some pressure over his right maxillary sinus area  He denies any ear pain, fever, chills, sweats.  He is tolerating food and fluids like usual. He has never had a similar headache. He denies any visual disturbance.  PMH: Smoking status noted ROS: Per HPI  Objective: BP 138/81   Pulse 67   Temp (!) 97.2 F (36.2 C) (Oral)   Ht _0  (1.676 m)   Wt 187 lb 6.4 oz (85 kg)   BMI 30.25 kg/m  Gen: NAD, alert, cooperative with exam HEENT: NCAT, most of her right temple over right maxillary sinus as well, turbinates swollen bilaterally, TMs normal bilaterally CV: RRR, good S1/S2, no murmur Resp: CTABL, no wheezes, non-labored Ext: No edema, warm Neuro: Alert and oriented, No gross deficits  Assessment and plan:  #Headache, sinus pain Patient possibly has simple acute maxillary sinusitis.  However he does have right temporal pain with tenderness on palpation. Sed rate to evaluate for possible temporal arteritis today Prednisone to go ahead and treat for possible temporal arteritis Augmentin to treat for sinusitis  It is nonfasting, his history of prediabetes    Orders Placed This Encounter  Procedures  . Sedimentation Rate  . CBC with Differential/Platelet  . CMP14+EGFR    Meds ordered this encounter  Medications  . predniSONE (DELTASONE) 20 MG tablet    Sig: 2 po at same time daily for 5 days    Dispense:  10 tablet    Refill:  0  . amoxicillin-clavulanate (AUGMENTIN) 875-125 MG tablet    Sig: Take 1 tablet by mouth 2 (two) times daily.    Dispense:  20 tablet    Refill:  0    Laroy Apple, MD [facility] 07/27/2017, 9:07  AM

## 2017-07-28 LAB — CBC WITH DIFFERENTIAL/PLATELET
Basophils Absolute: 0.1 10*3/uL (ref 0.0–0.2)
Basos: 1 %
EOS (ABSOLUTE): 0.4 10*3/uL (ref 0.0–0.4)
Eos: 6 %
Hematocrit: 46.7 % (ref 37.5–51.0)
Hemoglobin: 15.2 g/dL (ref 13.0–17.7)
Immature Grans (Abs): 0.1 10*3/uL (ref 0.0–0.1)
Immature Granulocytes: 1 %
Lymphocytes Absolute: 2 10*3/uL (ref 0.7–3.1)
Lymphs: 29 %
MCH: 28.6 pg (ref 26.6–33.0)
MCHC: 32.5 g/dL (ref 31.5–35.7)
MCV: 88 fL (ref 79–97)
Monocytes Absolute: 0.5 10*3/uL (ref 0.1–0.9)
Monocytes: 7 %
Neutrophils Absolute: 3.8 10*3/uL (ref 1.4–7.0)
Neutrophils: 56 %
Platelets: 276 10*3/uL (ref 150–450)
RBC: 5.32 x10E6/uL (ref 4.14–5.80)
RDW: 14.8 % (ref 12.3–15.4)
WBC: 6.8 10*3/uL (ref 3.4–10.8)

## 2017-07-28 LAB — CMP14+EGFR
ALT: 35 IU/L (ref 0–44)
AST: 25 IU/L (ref 0–40)
Albumin/Globulin Ratio: 1.4 (ref 1.2–2.2)
Albumin: 4.2 g/dL (ref 3.6–4.8)
Alkaline Phosphatase: 76 IU/L (ref 39–117)
BUN/Creatinine Ratio: 10 (ref 10–24)
BUN: 9 mg/dL (ref 8–27)
Bilirubin Total: 0.4 mg/dL (ref 0.0–1.2)
CO2: 22 mmol/L (ref 20–29)
Calcium: 9 mg/dL (ref 8.6–10.2)
Chloride: 103 mmol/L (ref 96–106)
Creatinine, Ser: 0.88 mg/dL (ref 0.76–1.27)
GFR calc Af Amer: 101 mL/min/{1.73_m2} (ref >59)
GFR calc non Af Amer: 88 mL/min/{1.73_m2} (ref >59)
Globulin, Total: 3.1 g/dL (ref 1.5–4.5)
Glucose: 121 mg/dL — ABNORMAL HIGH (ref 65–99)
Potassium: 4.6 mmol/L (ref 3.5–5.2)
Sodium: 139 mmol/L (ref 134–144)
Total Protein: 7.3 g/dL (ref 6.0–8.5)

## 2017-07-28 LAB — SEDIMENTATION RATE: Sed Rate: 7 mm/hr (ref 0–30)

## 2017-11-17 ENCOUNTER — Encounter: Payer: Self-pay | Admitting: *Deleted

## 2017-12-29 ENCOUNTER — Ambulatory Visit: Payer: Managed Care, Other (non HMO) | Admitting: Family Medicine

## 2018-01-02 ENCOUNTER — Encounter: Payer: Self-pay | Admitting: Family Medicine

## 2018-01-02 ENCOUNTER — Ambulatory Visit: Payer: Managed Care, Other (non HMO) | Admitting: Family Medicine

## 2018-01-02 VITALS — BP 125/82 | HR 65 | Temp 97.9°F | Ht 66.0 in | Wt 190.0 lb

## 2018-01-02 DIAGNOSIS — E119 Type 2 diabetes mellitus without complications: Secondary | ICD-10-CM | POA: Diagnosis not present

## 2018-01-02 DIAGNOSIS — Z23 Encounter for immunization: Secondary | ICD-10-CM

## 2018-01-02 LAB — BAYER DCA HB A1C WAIVED: HB A1C (BAYER DCA - WAIVED): 6.5 % (ref <7.0)

## 2018-01-02 NOTE — Patient Instructions (Signed)
Tu azcar en la sangre est bien controlada. Siga tomando la metformina una vez al da. Nos vemos en 6 meses por diabetes.   Diabetes y cuidados del pie (Diabetes and Foot Care) La diabetes puede ser la causa de que el flujo sanguneo (circulacin) en las piernas y los pies sea deficiente. Debido a esto, la piel de los pies se torna ms delgada, se rompe con facilidad y se cura ms lentamente. La piel puede estar seca, despellejarse y Medical illustrator. Tambin pueden estar daados los nervios de las piernas y de los pies lo que provoca una disminucin de la sensibilidad. Es posible que no advierta heridas ms pequeas en los pies, que pueden causar infecciones graves. Cuidar sus pies es una de las cosas ms importantes que puede hacer por usted mismo. INSTRUCCIONES PARA EL CUIDADO EN EL HOGAR  Use siempre calzado, an dentro de su casa. No camine descalzo. Caminar descalzo facilita que se lastime.  Controle sus pies diariamente para observar ampollas, cortes y enrojecimiento. Si no puede ver la planta del pie, use un espejo o pdale ayuda a Nurse, children's.  Lave sus pies con agua tibia (no use agua caliente) y un [place]. Seque bien sus pies, y la zona [employer] dedos dando [place], hasta que estn completamente secos. Noremoje los pies, ya que esto puede resecar la piel.  Aplique una locin hidratante o vaselina (que no contenga alcohol ni perfume) en los pies y en las uas secas y [place]. No aplique locin entre los dedos.  Recorte las uas en forma recta. No escarbe debajo de las uas o alrededor [employer]. Lime los bordes de las uas con una lima o esmeril.  No corte las durezas o callosidades, ni trate de quitarlas con medicamentos.  Use calcetines de algodn o medias [employer]. Asegrese de que no le [employer]. Nouse calcetines que le lleguen a las rodillas, ya que podran disminuir el flujo de sangre a las piernas.  Use zapatos de cuero que le queden bien  y que sean acolchados. Para amoldar los zapatos, clcelos slo algunas horas por da. Esto evitar lesiones en los pies. Revise siempre los zapatos antes de ponerlos para asegurarse de que no haya objetos en su interior.  No cruce las piernas. Esto puede disminuir el flujo de sangre a los pies.  Si algo le ha raspado, cortado o lastimado la piel de los pies, mantenga la piel de esa zona limpia y [place]. Debe higienizar estas zonas con agua y un jabn suave. No limpie la zona con agua oxigenada, alcohol ni yodo.  Cuando se quite un vendaje adhesivo, asegrese de no daar la piel.  Si tiene una herida, obsrvela varias veces por da para asegurarse de que se est curando.  No use bolsas de agua caliente ni almohadillas trmicas. Podran causar quemaduras. Si ha perdido la sensibilidad en los pies o las piernas, no sabr lo que le est sucediendo hasta que sea demasiado tarde.  Asegrese de que su mdico le haga un examen completo de los pies por lo menos una vez al ao, o con ms frecuencia si usted tiene [employer]. Informe todos los cortes, llagas o moretones a su mdico inmediatamente.  SOLICITE ATENCIN MDICA SI:  Tiene una lesin que no se cura.  Tiene cortes o rajaduras en la piel.  Tiene una ua encarnada.  Nota una zona irritada en las piernas o los pies.  Siente una sensacin de ardor u hormigueo en las  piernas o los pies.  Siente dolor o calambres en las piernas o los pies.  Las piernas o los pies estn adormecidos.  Siente los pies siempre fros.  SOLICITE ATENCIN MDICA DE INMEDIATO SI:  Presenta enrojecimiento, hinchazn o aumento del dolor en una herida.  Nota una lnea roja que sube por pierna.  Aparece pus en la herida.  Le sube la fiebre o segn lo que le indique el mdico.  Advierte un olor ftido que proviene de una lcera o una herida.  Esta informacin no tiene Marine scientist el consejo del mdico. Asegrese de hacerle al mdico cualquier  pregunta que tenga. Document Released: 01/17/2005 Document Revised: 05/11/2015 Document Reviewed: 06/26/2012 Elsevier Interactive Patient Education  2017 [employer].
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
## 2018-07-04 ENCOUNTER — Ambulatory Visit: Payer: Managed Care, Other (non HMO) | Admitting: Family Medicine

## 2018-07-06 ENCOUNTER — Ambulatory Visit: Payer: Managed Care, Other (non HMO) | Admitting: Family Medicine

## 2018-07-19 ENCOUNTER — Encounter: Payer: Self-pay | Admitting: Internal Medicine

## 2018-07-27 ENCOUNTER — Ambulatory Visit: Payer: Managed Care, Other (non HMO) | Admitting: Family Medicine

## 2018-08-17 ENCOUNTER — Other Ambulatory Visit: Payer: Self-pay

## 2018-08-17 ENCOUNTER — Encounter: Payer: Self-pay | Admitting: Family Medicine

## 2018-08-17 ENCOUNTER — Ambulatory Visit (INDEPENDENT_AMBULATORY_CARE_PROVIDER_SITE_OTHER): Payer: Managed Care, Other (non HMO) | Admitting: Family Medicine

## 2018-08-17 VITALS — BP 128/83 | HR 61 | Temp 98.4°F | Ht 66.0 in | Wt 188.0 lb

## 2018-08-17 DIAGNOSIS — M25512 Pain in left shoulder: Secondary | ICD-10-CM

## 2018-08-17 DIAGNOSIS — M25511 Pain in right shoulder: Secondary | ICD-10-CM | POA: Diagnosis not present

## 2018-08-17 DIAGNOSIS — G8929 Other chronic pain: Secondary | ICD-10-CM | POA: Diagnosis not present

## 2018-08-17 DIAGNOSIS — Z23 Encounter for immunization: Secondary | ICD-10-CM | POA: Diagnosis not present

## 2018-08-17 DIAGNOSIS — E119 Type 2 diabetes mellitus without complications: Secondary | ICD-10-CM | POA: Diagnosis not present

## 2018-08-17 LAB — BAYER DCA HB A1C WAIVED: HB A1C (BAYER DCA - WAIVED): 6.8 % (ref <7.0)

## 2018-08-17 MED ORDER — METFORMIN HCL 500 MG PO TABS: 500.0000 mg | ORAL_TABLET | Freq: Two times a day (BID) | ORAL | 1 refills | Status: DC

## 2018-08-17 MED ORDER — ATORVASTATIN CALCIUM 20 MG PO TABS: 20.0000 mg | ORAL_TABLET | Freq: Every day | ORAL | 3 refills | Status: DC

## 2018-08-17 MED ORDER — CLOTRIMAZOLE 1 % EX CREA: 1.0000 "application " | TOPICAL_CREAM | Freq: Two times a day (BID) | CUTANEOUS | 0 refills | Status: DC

## 2018-08-17 NOTE — Progress Notes (Signed)
Subjective: CC: blood sugar check PCP: Tom Norlander, DO QHU:[id number] Peel is a 73 y.o. male presenting to clinic today for:   [employer] video interpreter Tom [name], Dailey used for Spanish translation of this visit  1. Type 2 Diabetes w/ microalbuminuria:  Patient reports compliance with metformin XR 500 mg daily.  Not on cholesterol medication.  Last eye exam: Needs Sees Dr. Manuella [name] with [facility] Last foot exam: needs Last A1c:  Lab Results  Component Value Date   HGBA1C 6.5 01/02/2018   Nephropathy screen indicated?: Yes Last flu, zoster and/or pneumovax: ? PNA vaccine  Denies polyuria, polydipsia, unintended weight loss/gain, foot ulcerations, numbness or tingling in extremities, shortness of breath or chest pain.  2.  Bilateral shoulder pain Patient reports longstanding history of bilateral shoulder pain.  He notes that it seems to be getting worse over the last several weeks.  He states that is particularly apparent when he raises his arms above his head while showering or does any overhead activities.  He denies any weakness or fatigue.  He has used over-the-counter remedies for this but has had no substantial improvement.  Denies any sensation changes or preceding injury.  ROS: Per HPI  No Known Allergies Past Medical History:  Diagnosis Date  . Diabetes mellitus without complication ([place])   . Hemorrhoids     Current Outpatient Medications:  Tom [name] Oil (OMEGA-3) 500 MG CAPS, Take 500 mg by mouth daily., Disp: , Rfl:  .  metFORMIN (GLUCOPHAGE-XR) 500 MG 24 hr tablet, TAKE 1 TABLET BY MOUTH ONCE DAILY WITH BREAKFAST, Disp: , Rfl: 12   Social History   Socioeconomic History  . Marital status: Single    Spouse name: Not on file  . Number of children: Not on file  . Years of education: Not on file  . Highest education level: Not on file  Occupational History  . Not on file  Social Needs  . Financial resource strain: Not on file  . Food insecurity   Worry: Not on file    Inability: Not on file  . Transportation needs    Medical: Not on file    Non-medical: Not on file  Tobacco Use  . Smoking status: Former Research scientist (life sciences)  . Smokeless tobacco: Never Used  Substance and Sexual Activity  . Alcohol use: No  . Drug use: No  . Sexual activity: Not on file  Lifestyle  . Physical activity    Days per week: Not on file    Minutes per session: Not on file  . Stress: Not on file  Relationships  . Social Herbalist on phone: Not on file    Gets together: Not on file    Attends religious service: Not on file    Active member of club or organization: Not on file    Attends meetings of clubs or organizations: Not on file    Relationship status: Not on file  . Intimate partner violence    Fear of current or ex partner: Not on file    Emotionally abused: Not on file    Physically abused: Not on file    Forced sexual activity: Not on file  Other Topics Concern  . Not on file  Social History Narrative  . Not on file   Family History  Problem Relation Age of Onset  . Cancer Mother   . Heart Problems Father   . Stomach cancer Brother     Objective: Office vital signs  reviewed. BP 128/83   Pulse 61   Temp 98.4 F (36.9 C) (Oral)   Ht '5\' 6"'$  (1.676 m)   Wt 188 lb (85.3 kg)   BMI 30.34 kg/m   Physical Examination:  General: Awake, alert, well nourished, No acute distress HEENT: Normal, sclera white Cardio: regular rate and rhythm, S1S2 heard, no murmurs appreciated Pulm: clear to auscultation bilaterally, no wheezes, rhonchi or rales; normal work of breathing on room air Extremities: warm, well perfused, No edema, cyanosis or clubbing; +2 pulses bilaterally MSK: normal gait and normal station; reduced AROM of bilateral UE 2/2 pain with movement. No gross visible or palpable deformities. Neuro: UE light touch sensation grossly in tact. See below DM foot exam Diabetic Foot Exam - Simple   Simple Foot Form Diabetic Foot exam  was performed with the following findings: Yes 08/17/2018  9:47 AM  Visual Inspection No deformities, no ulcerations, no other skin breakdown bilaterally: Yes Sensation Testing Intact to touch and monofilament testing bilaterally: Yes Pulse Check Posterior Tibialis and Dorsalis pulse intact bilaterally: Yes Comments Onychomycotic changes to bilateral nails multiple toenails.  No ulcerations or pre-ulcerative calluses     Assessment/ Plan: 73 y.o. male   1. Diabetes mellitus without complication ([place]) Under excellent control with A1c of 6.8 today.  I did go ahead and switch his metformin to the twice daily dosing given the recent recall on metformin XR.  I discussed this change with him.  I have added Lipitor given diabetic state.  Check lipid panel today, CMP.  Pneumococcal vaccination administered. - Bayer DCA Hb A1c Waived - Microalbumin / creatinine urine ratio - Lipid Panel - CMP14+EGFR  2. Chronic pain of both shoulders Referral to orthopedics for further evaluation.  May benefit from steroid injection as he has benefited from knee injections in the past. - Ambulatory referral to Orthopedic Surgery   Orders Placed This Encounter  Procedures  . Bayer DCA Hb A1c Waived  . Microalbumin / creatinine urine ratio   Meds ordered this encounter  Medications  . clotrimazole (LOTRIMIN) 1 % cream    Sig: Apply 1 application topically 2 (two) times daily.    Dispense:  30 g    Refill:  0  . metFORMIN (GLUCOPHAGE) 500 MG tablet    Sig: Take 1 tablet (500 mg total) by mouth 2 (two) times daily with a meal.    Dispense:  180 tablet    Refill:  1  . atorvastatin (LIPITOR) 20 MG tablet    Sig: Take 1 tablet (20 mg total) by mouth daily.    Dispense:  90 tablet    Refill:  [place], [place] [phone]

## 2018-08-17 NOTE — Addendum Note (Signed)
Addended byCarrolyn Leigh on: 08/17/2018 02:48 PM   Modules accepted: Orders

## 2018-08-17 NOTE — Patient Instructions (Signed)
Diabetes mellitus y nutricin, en adultos Diabetes Mellitus and Nutrition, Adult Si sufre de diabetes (diabetes mellitus), es muy importante tener hbitos alimenticios saludables debido a que sus niveles de Designer, television/film set sangre (glucosa) se ven afectados en gran medida por lo que come y bebe. [place] alimentos saludables en las cantidades [place], aproximadamente a la [employer], [place] ayudar a:  Aeronautical engineer glucemia.  Disminuir el riesgo de sufrir una enfermedad cardaca.  Mejorar la presin arterial.  Science writer o mantener un peso saludable. Todas las personas que sufren de diabetes son diferentes y cada una tiene necesidades diferentes en cuanto a un plan de alimentacin. El mdico puede recomendarle que trabaje con un especialista en dietas y nutricin (nutricionista) para Financial trader plan para usted. Su plan de alimentacin puede variar segn factores como:  Las caloras que necesita.  Los medicamentos que toma.  Su peso.  Sus niveles de glucemia, presin arterial y colesterol.  Su nivel de [place].  Otras afecciones que tenga, como enfermedades cardacas o renales. Cmo me afectan los carbohidratos? Los carbohidratos, o hidratos de carbono, afectan su nivel de glucemia ms que cualquier otro tipo de alimento. La ingesta de carbohidratos naturalmente aumenta la cantidad de [employer]. El recuento de carbohidratos es un mtodo destinado a Catering manager un registro de la cantidad de carbohidratos que se consumen. El recuento de carbohidratos es importante para Theatre manager la glucemia a un nivel saludable, especialmente si utiliza insulina o toma determinados medicamentos por va oral para la diabetes. Es importante conocer la cantidad de carbohidratos que se pueden ingerir en cada comida sin correr Engineer, manufacturing. Esto es Psychologist, forensic. Su nutricionista puede ayudarlo a calcular la cantidad de carbohidratos que debe ingerir en cada comida y en cada  refrigerio. Entre los alimentos que contienen carbohidratos, se incluyen:  Pan, cereal, arroz, pastas y galletas.  Papas y maz.  Guisantes, frijoles y lentejas.  Leche y Estate agent.  Lambert Mody y [place].  Postres, como pasteles, galletas, helado y caramelos. Cmo me afecta el alcohol? El alcohol puede provocar disminuciones sbitas de la glucemia (hipoglucemia), especialmente si utiliza insulina o toma determinados medicamentos por va oral para la diabetes. La hipoglucemia es una afeccin potencialmente mortal. Los sntomas de la hipoglucemia (somnolencia, mareos y confusin) son similares a los sntomas de haber consumido demasiado alcohol. Si el mdico afirma que el alcohol es seguro para usted, [place] estas pautas:  Limite el consumo de alcohol a no ms de 26mdida por da si es mujer y no est [place] y a 219midas si es hombre. Una medida equivale a 12oz (35564mde cerveza, 5oz (148m60me vino o 1oz (44ml75m bebidas alcohlicas de alta graduacin.  No beba con el estmago vaco.  Mantngase hidratado bebiendo agua, refrescos dietticos o t helado sin azcar.  Tenga en cuenta que los refrescos comunes, los jugos y otras bebida para mezclOptician, dispensingen contener mucha azcar y se deben contar como carbohidratos. Cules son algunos consejos para seguir este plan?  Leer las etiquetas de los alimentos  Comience por leer el tamao de la porcin en la "Informacin nutricional" en las etiquetas de los alimentos envasados y las bebidas. La cantidad de caloras, carbohidratos, grasas y otros nutrientes mencionados en la etiqueta se basan en una porcin del alimento. Muchos alimentos contienen ms de una porcin por envase.  Verifique la cantidad total de gramos (g) de carbohidratos totales en una porcin. Puede calcular la cantidad de porciones de carbohidratos al dividir el  Muchos alimentos contienen ms de una porcin por envase.   Verifique la cantidad total de gramos (g) de carbohidratos totales en una porcin. Puede calcular la cantidad de porciones de carbohidratos al dividir el total de carbohidratos por 15. Por ejemplo, si un alimento tiene un total de 30g de carbohidratos, equivale a 2  porciones de carbohidratos.   Verifique la cantidad de gramos (g) de grasas saturadas y grasas trans en una porcin. Escoja alimentos que no contengan grasa o que tengan un bajo contenido.   Verifique la cantidad de miligramos (mg) de sal (sodio) en una porcin. La mayora de las personas deben limitar la ingesta de sodio total a menos de 2300mg por da.   Siempre consulte la informacin nutricional de los alimentos etiquetados como "con bajo contenido de grasa" o "sin grasa". Estos alimentos pueden tener un mayor contenido de azcar agregada o carbohidratos refinados, y deben evitarse.   Hable con su nutricionista para identificar sus objetivos diarios en cuanto a los nutrientes mencionados en la etiqueta.  Al ir de compras   Evite comprar alimentos procesados, enlatados o precocinados. Estos alimentos tienden a tener una mayor cantidad de grasa, sodio y azcar agregada.   Compre en la zona exterior de la tienda de comestibles. Esta zona incluye frutas y verduras frescas, granos a granel, carnes frescas y productos lcteos frescos.  Al cocinar   Utilice mtodos de coccin a baja temperatura, como hornear, en lugar de mtodos de coccin a alta temperatura, como frer en abundante aceite.   Cocine con aceites saludables, como el aceite de oliva, canola o girasol.   Evite cocinar con manteca, crema o carnes con alto contenido de grasa.  Planificacin de las comidas   Coma las comidas y los refrigerios regularmente, preferentemente a la misma hora todos los das. Evite pasar largos perodos de tiempo sin comer.   Consuma alimentos ricos en fibra, como frutas frescas, verduras, frijoles y cereales integrales. Consulte a su nutricionista sobre cuntas porciones de carbohidratos puede consumir en cada comida.   Consuma entre 4 y 6 onzas (oz) de protenas magras por da, como carnes magras, pollo, pescado, huevos o tofu. Una onza de protena magra equivale a:  ? 1 onza de carne, pollo o  pescado.  ? 1huevo.  ?  taza de tofu.   Coma algunos alimentos por da que contengan grasas saludables, como aguacates, frutos secos, semillas y pescado.  Estilo de vida   Controle su nivel de glucemia con regularidad.   Haga actividad fsica habitualmente como se lo haya indicado el mdico. Esto puede incluir lo siguiente:  ? 150minutos semanales de ejercicio de intensidad moderada o alta. Esto podra incluir caminatas dinmicas, ciclismo o gimnasia acutica.  ? Realizar ejercicios de elongacin y de fortalecimiento, como yoga o levantamiento de pesas, por lo menos 2veces por semana.   Tome los medicamentos como se lo haya indicado el mdico.   No consuma ningn producto que contenga nicotina o tabaco, como cigarrillos y cigarrillos electrnicos. Si necesita ayuda para dejar de fumar, consulte al mdico.   Trabaje con un asesor o instructor en diabetes para identificar estrategias para controlar el estrs y cualquier desafo emocional y social.  Preguntas para hacerle al mdico   Es necesario que consulte a un instructor en el cuidado de la diabetes?   Es necesario que me rena con un nutricionista?   A qu nmero puedo llamar si tengo preguntas?   Cules son los mejores momentos para controlar la glucemia?  Dnde   Trabajar con un especialista en dietas y nutricin (nutricionista) puede ayudarlo a Insurance claims handler de alimentacin para usted.  Tenga en cuenta que los carbohidratos (hidratos de carbono) y el alcohol tienen  efectos inmediatos en sus niveles de glucemia. Es importante contar los carbohidratos que ingiere y consumir alcohol con prudencia. Esta informacin no tiene Marine scientist el consejo del mdico. Asegrese de hacerle al mdico cualquier pregunta que tenga. Document Released: 04/26/2007 Document Revised: 09/27/2016 Document Reviewed: 05/09/2016 Elsevier Patient Education  2020 [employer].

## 2018-08-18 LAB — CMP14+EGFR
ALT: 35 IU/L (ref 0–44)
AST: 29 IU/L (ref 0–40)
Albumin/Globulin Ratio: 1.5 (ref 1.2–2.2)
Albumin: 4.5 g/dL (ref 3.8–4.8)
Alkaline Phosphatase: 84 IU/L (ref 39–117)
BUN/Creatinine Ratio: 15 (ref 10–24)
BUN: 13 mg/dL (ref 8–27)
Bilirubin Total: 0.4 mg/dL (ref 0.0–1.2)
CO2: 22 mmol/L (ref 20–29)
Calcium: 9.3 mg/dL (ref 8.6–10.2)
Chloride: 101 mmol/L (ref 96–106)
Creatinine, Ser: 0.88 mg/dL (ref 0.76–1.27)
GFR calc Af Amer: 101 mL/min/{1.73_m2} (ref >59)
GFR calc non Af Amer: 87 mL/min/{1.73_m2} (ref >59)
Globulin, Total: 3.1 g/dL (ref 1.5–4.5)
Glucose: 110 mg/dL — ABNORMAL HIGH (ref 65–99)
Potassium: 4.7 mmol/L (ref 3.5–5.2)
Sodium: 144 mmol/L (ref 134–144)
Total Protein: 7.6 g/dL (ref 6.0–8.5)

## 2018-08-18 LAB — LIPID PANEL
Chol/HDL Ratio: 5.5 ratio — ABNORMAL HIGH (ref 0.0–5.0)
Cholesterol, Total: 193 mg/dL (ref 100–199)
HDL: 35 mg/dL — ABNORMAL LOW (ref >39)
LDL Calculated: 121 mg/dL — ABNORMAL HIGH (ref 0–99)
Triglycerides: 187 mg/dL — ABNORMAL HIGH (ref 0–149)
VLDL Cholesterol Cal: 37 mg/dL (ref 5–40)

## 2018-09-04 ENCOUNTER — Other Ambulatory Visit: Payer: Self-pay

## 2018-09-04 ENCOUNTER — Encounter: Payer: Self-pay | Admitting: Orthopaedic Surgery

## 2018-09-04 ENCOUNTER — Ambulatory Visit: Payer: Managed Care, Other (non HMO) | Admitting: Orthopaedic Surgery

## 2018-09-04 ENCOUNTER — Ambulatory Visit: Payer: Managed Care, Other (non HMO)

## 2018-09-04 VITALS — BP 129/77 | HR 64 | Temp 97.3°F | Ht 66.0 in | Wt 191.0 lb

## 2018-09-04 DIAGNOSIS — G8929 Other chronic pain: Secondary | ICD-10-CM

## 2018-09-04 DIAGNOSIS — M25512 Pain in left shoulder: Secondary | ICD-10-CM

## 2018-09-04 DIAGNOSIS — M25511 Pain in right shoulder: Secondary | ICD-10-CM
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
## 2018-09-18 ENCOUNTER — Other Ambulatory Visit: Payer: Self-pay | Admitting: Family Medicine

## 2018-09-18 DIAGNOSIS — E119 Type 2 diabetes mellitus without complications: Secondary | ICD-10-CM

## 2018-10-02 ENCOUNTER — Ambulatory Visit (INDEPENDENT_AMBULATORY_CARE_PROVIDER_SITE_OTHER): Payer: Managed Care, Other (non HMO) | Admitting: Orthopaedic Surgery

## 2018-10-02 ENCOUNTER — Encounter: Payer: Self-pay | Admitting: Orthopaedic Surgery

## 2018-10-02 ENCOUNTER — Other Ambulatory Visit: Payer: Self-pay

## 2018-10-02 VITALS — BP 116/68 | HR 64 | Ht 66.0 in | Wt 190.0 lb

## 2018-10-02 DIAGNOSIS — M25511 Pain in right shoulder: Secondary | ICD-10-CM

## 2018-10-02 DIAGNOSIS — M25512 Pain in left shoulder: Secondary | ICD-10-CM | POA: Diagnosis not present

## 2018-10-02 DIAGNOSIS — G8929 Other chronic pain: Secondary | ICD-10-CM

## 2018-10-02 NOTE — Patient Instructions (Signed)
He will need MRI of the left shoulder in about two to three weeks.
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
## 2018-10-04 ENCOUNTER — Telehealth: Payer: Self-pay

## 2018-10-04 NOTE — Telephone Encounter (Signed)
Patient's insurance denied MRI. He needs 6 wks of conservative treatment including therapy. I have ordered Physical Therapy @[facility] 

## 2018-10-04 NOTE — Addendum Note (Signed)
Addended by: Derek Mound A on: 10/04/2018 03:40 PM   Modules accepted: Orders

## 2018-10-04 NOTE — Addendum Note (Signed)
Addended by: Derek Mound A on: 10/04/2018 04:27 PM   Modules accepted: Orders

## 2018-10-08 NOTE — Telephone Encounter (Signed)
Noted  

## 2018-10-30 ENCOUNTER — Ambulatory Visit: Payer: Managed Care, Other (non HMO) | Admitting: Orthopaedic Surgery

## 2018-11-13 ENCOUNTER — Encounter: Payer: Self-pay | Admitting: Orthopaedic Surgery

## 2018-11-13 ENCOUNTER — Other Ambulatory Visit: Payer: Self-pay

## 2018-11-13 ENCOUNTER — Ambulatory Visit (INDEPENDENT_AMBULATORY_CARE_PROVIDER_SITE_OTHER): Payer: Managed Care, Other (non HMO) | Admitting: Orthopaedic Surgery

## 2018-11-13 VITALS — BP 136/86 | HR 89 | Temp 97.2°F | Ht 66.0 in | Wt 188.0 lb

## 2018-11-13 DIAGNOSIS — G8929 Other chronic pain: Secondary | ICD-10-CM

## 2018-11-13 DIAGNOSIS — M25512 Pain in left shoulder: Secondary | ICD-10-CM

## 2018-11-13 DIAGNOSIS — M25511 Pain in right shoulder: Secondary | ICD-10-CM
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
## 2018-11-27 ENCOUNTER — Ambulatory Visit: Payer: Managed Care, Other (non HMO) | Admitting: Orthopaedic Surgery

## 2018-11-29 ENCOUNTER — Encounter (HOSPITAL_COMMUNITY): Payer: Self-pay | Admitting: Occupational Therapy

## 2018-11-29 ENCOUNTER — Ambulatory Visit (HOSPITAL_COMMUNITY): Payer: Managed Care, Other (non HMO) | Attending: Orthopaedic Surgery | Admitting: Occupational Therapy

## 2018-11-29 ENCOUNTER — Other Ambulatory Visit: Payer: Self-pay

## 2018-11-29 DIAGNOSIS — R29898 Other symptoms and signs involving the musculoskeletal system: Secondary | ICD-10-CM | POA: Diagnosis present

## 2018-11-29 DIAGNOSIS — M25512 Pain in left shoulder: Secondary | ICD-10-CM | POA: Diagnosis present

## 2018-11-29 DIAGNOSIS — M25511 Pain in right shoulder: Secondary | ICD-10-CM | POA: Insufficient documentation

## 2018-11-29 DIAGNOSIS — G8929 Other chronic pain: Secondary | ICD-10-CM | POA: Diagnosis present
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
## 2018-11-29 NOTE — Patient Instructions (Signed)
  1) Flexion Wall Stretch    Face wall, place affected handon wall in front of you. Slide hand up the wall  and lean body in towards the wall. Hold for 10 seconds. Repeat 3-5 times. 1-2 times/day.     2) Towel Stretch with Internal Rotation   Or     Gently pull up (or to the side) your affected arm  behind your back with the assist of a towel. Hold 10 seconds, repeat 3-5 times. 1-2 times/day.             3) Corner Stretch    Stand at a corner of a wall, place your arms on the walls with elbows bent. Lean into the corner until a stretch is felt along the front of your chest and/or shoulders. Hold for 10 seconds. Repeat 3-5X, 1-2 times/day.    4) Posterior Capsule Stretch    Bring the involved arm across chest. Grasp elbow and pull toward chest until you feel a stretch in the back of the upper arm and shoulder. Hold 10 seconds. Repeat 3-5X. Complete 1-2 times/day.    5) Scapular Retraction    Tuck chin back as you pinch shoulder blades together.  Hold 5 seconds. Repeat 3-5X. Complete 1-2 times/day.    6) External Rotation Stretch:     Place your affected hand on the wall with the elbow bent and gently turn your body the opposite direction until a stretch is felt. Hold 10 seconds, repeat 3-5X. Complete 1-2 times/day.     

## 2018-11-29 NOTE — Addendum Note (Signed)
Addended by: Guadelupe Sabin A on: 11/29/2018 05:49 PM   Modules accepted: Orders

## 2018-12-04 ENCOUNTER — Encounter: Payer: Self-pay | Admitting: Orthopaedic Surgery

## 2018-12-04 ENCOUNTER — Ambulatory Visit: Payer: Managed Care, Other (non HMO) | Admitting: Orthopaedic Surgery

## 2018-12-04 ENCOUNTER — Other Ambulatory Visit: Payer: Self-pay

## 2018-12-04 DIAGNOSIS — M25511 Pain in right shoulder: Secondary | ICD-10-CM | POA: Diagnosis not present

## 2018-12-04 DIAGNOSIS — G8929 Other chronic pain: Secondary | ICD-10-CM
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
## 2018-12-12 ENCOUNTER — Encounter (HOSPITAL_COMMUNITY): Payer: Self-pay | Admitting: Occupational Therapy

## 2018-12-12 ENCOUNTER — Telehealth (HOSPITAL_COMMUNITY): Payer: Self-pay | Admitting: Occupational Therapy

## 2018-12-12 NOTE — Telephone Encounter (Signed)
pt has other plans for today per his dtr

## 2018-12-18 ENCOUNTER — Ambulatory Visit (HOSPITAL_COMMUNITY): Payer: Managed Care, Other (non HMO) | Attending: Orthopaedic Surgery

## 2018-12-18 ENCOUNTER — Other Ambulatory Visit: Payer: Self-pay

## 2018-12-18 ENCOUNTER — Encounter (HOSPITAL_COMMUNITY): Payer: Self-pay

## 2018-12-18 DIAGNOSIS — R29898 Other symptoms and signs involving the musculoskeletal system: Secondary | ICD-10-CM | POA: Insufficient documentation

## 2018-12-18 DIAGNOSIS — M25512 Pain in left shoulder: Secondary | ICD-10-CM | POA: Diagnosis present

## 2018-12-18 DIAGNOSIS — M25511 Pain in right shoulder: Secondary | ICD-10-CM | POA: Diagnosis present

## 2018-12-18 DIAGNOSIS — G8929 Other chronic pain: Secondary | ICD-10-CM | POA: Diagnosis present
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
## 2018-12-25 ENCOUNTER — Encounter: Payer: Self-pay | Admitting: Orthopaedic Surgery

## 2018-12-25 ENCOUNTER — Other Ambulatory Visit: Payer: Self-pay

## 2018-12-25 ENCOUNTER — Ambulatory Visit (INDEPENDENT_AMBULATORY_CARE_PROVIDER_SITE_OTHER): Payer: Managed Care, Other (non HMO) | Admitting: Orthopaedic Surgery

## 2018-12-25 VITALS — BP 135/78 | HR 68 | Temp 97.9°F | Ht 61.0 in | Wt 191.0 lb

## 2018-12-25 DIAGNOSIS — M25512 Pain in left shoulder: Secondary | ICD-10-CM

## 2018-12-25 DIAGNOSIS — M25511 Pain in right shoulder: Secondary | ICD-10-CM

## 2018-12-25 DIAGNOSIS — G8929 Other chronic pain: Secondary | ICD-10-CM
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
## 2018-12-26 ENCOUNTER — Encounter (HOSPITAL_COMMUNITY): Payer: Self-pay | Admitting: Occupational Therapy

## 2019-01-02 ENCOUNTER — Other Ambulatory Visit: Payer: Self-pay

## 2019-01-02 ENCOUNTER — Ambulatory Visit (HOSPITAL_COMMUNITY): Payer: Managed Care, Other (non HMO) | Attending: Orthopaedic Surgery

## 2019-01-02 ENCOUNTER — Encounter (HOSPITAL_COMMUNITY): Payer: Self-pay

## 2019-01-02 DIAGNOSIS — M25511 Pain in right shoulder: Secondary | ICD-10-CM | POA: Insufficient documentation

## 2019-01-02 DIAGNOSIS — M25512 Pain in left shoulder: Secondary | ICD-10-CM | POA: Diagnosis present

## 2019-01-02 DIAGNOSIS — R29898 Other symptoms and signs involving the musculoskeletal system: Secondary | ICD-10-CM | POA: Insufficient documentation

## 2019-01-02 DIAGNOSIS — G8929 Other chronic pain: Secondary | ICD-10-CM | POA: Diagnosis present

## 2019-01-02 NOTE — Patient Instructions (Signed)
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
## 2019-01-08 ENCOUNTER — Encounter: Payer: Self-pay | Admitting: Orthopedic Surgery

## 2019-01-08 ENCOUNTER — Ambulatory Visit: Payer: Managed Care, Other (non HMO) | Admitting: Orthopedic Surgery

## 2019-01-08 ENCOUNTER — Other Ambulatory Visit: Payer: Self-pay

## 2019-01-08 VITALS — BP 117/72 | HR 76 | Temp 97.9°F | Ht 65.0 in | Wt 189.0 lb

## 2019-01-08 DIAGNOSIS — M19011 Primary osteoarthritis, right shoulder: Secondary | ICD-10-CM

## 2019-01-08 DIAGNOSIS — M75121 Complete rotator cuff tear or rupture of right shoulder, not specified as traumatic: Secondary | ICD-10-CM | POA: Diagnosis not present

## 2019-01-08 NOTE — Patient Instructions (Addendum)
Dr Marlou Sa in [place] does shoulder replacements his office will call you with appointment for evaluation the phone number to his office is [phone] the address is [place] articulacin del hombro Shoulder Joint Replacement El reemplazo de la articulacin del hombro es una ciruga para Training and development officer las partes daadas de la articulacin del hombro con partes artificiales (prtesis). Se pueden usar United Stationers partes para TEFL teacher articulacin:  El componente humeral reemplaza la cabeza del hueso de la parte superior del brazo (hmero). Se trata de una bola redonda sujeta a un vstago que se encaja en el hmero.  El componente glenoideo reemplaza la cavidad (depresin glenoidea). Por lo general, las prtesis son de metal y plstico. En funcin del dao sufrido en el hombro, el cirujano puede reemplazar solo la cabeza humeral (hemiartroplastia) o la cabeza humeral y la cavidad glenoidea (reemplazo total de hombro). Los msculos y tendones circundantes sostienen las piezas protsicas en TEFL teacher. Este procedimiento se Optometrist a fin de Catering manager o para tratar la artritis o fracturas graves en el hombro. Esta ciruga se puede realizar si otros tratamientos no quirrgicos no han funcionado. Informe al mdico acerca de lo siguiente:  Cualquier alergia que tenga.  Todos los [employer], incluidos vitaminas, hierbas, gotas oftlmicas, cremas y medicamentos de venta libre.  Cualquier problema previo que usted o algn miembro de su familia haya tenido con los anestsicos.  Cualquier enfermedad de la sangre que tenga.  Cirugas previas a las que se someti.  Cualquier enfermedad que tenga.  Si est embarazada o podra estarlo. Cules son los riesgos? En general, se trata de un procedimiento seguro. Sin embargo, pueden ocurrir complicaciones, por ejemplo:  Infeccin.  Hemorragia.  Reacciones alrgicas a los  medicamentos.  Dao a otras estructuras, rganos o nervios.  Fractura del hueso de la parte superior del brazo durante o despus de la [place].  Inestabilidad del hombro despus de la [place].  Aflojamiento del componente glenoideo con el tiempo.  Crecimiento seo inusual.  Dificultad para consolidarse el hueso despus de la [place]. Qu ocurre antes del procedimiento? Medicamentos  Consulte al mdico si debe hacer o no lo siguiente: ? Cambiar o suspender los medicamentos que toma habitualmente. Esto es muy importante si toma medicamentos para la diabetes o anticoagulantes. ? Tomar medicamentos como aspirina e ibuprofeno. Estos medicamentos pueden tener un efecto anticoagulante en la [place]. No tome estos medicamentos antes del procedimiento si su mdico le indica que no lo haga.  Pueden indicarle un antibitico para ayudar a prevenir infecciones. Mantenerse hidratado Siga las indicaciones del mdico acerca de la hidratacin, las cuales pueden incluir lo siguiente:  Hasta 2horas antes del procedimiento, puede beber lquidos transparentes, como agua, jugos frutales transparentes, caf negro y t solo. Restricciones en las comidas y bebidas Siga las indicaciones del mdico respecto de las comidas y bebidas, las cuales pueden incluir lo siguiente:  Ocho horas antes del procedimiento, deje de ingerir comidas o alimentos pesados, por ejemplo, carne, alimentos fritos o alimentos grasos.  Seis horas antes del procedimiento, deje de ingerir comidas o alimentos livianos, como tostadas o cereales.  Seis horas antes del procedimiento, deje de beber [place] o bebidas que [employer].  Dos horas antes del procedimiento, deje de beber lquidos transparentes. Instrucciones generales  Haga planes para que una persona lo lleve a su casa desde el hospital o la clnica.  Pida a alguien que lo [employer]  despus del procedimiento. Tambin se recomienda que le pida a alguien que  lo ayude en la casa durante las primeras semanas posteriores al procedimiento.  No consuma ningn producto que contenga nicotina o tabaco, como cigarrillos y Psychologist, sport and exercise. Si necesita ayuda para dejar de fumar, consulte al [employer].  Pregntele al mdico cmo se Scientist, clinical (histocompatibility and immunogenetics) o se Museum/gallery curator de la Leisure centre manager. Qu ocurre durante el procedimiento?  Para disminuir el riesgo de contraer una infeccin: ? El equipo mdico se lavar o se Transport planner. ? Le lavarn la piel con jabn. ? Pueden rasurarle la zona [place].  Le colocarn un tubo (catter) intravenoso en una de las venas.  Le administrarn uno o ms de los siguientes medicamentos: ? Un medicamento para ayudarlo a relajarse (sedante). ? Un medicamento para hacerlo dormir (anestesia general). ? Un medicamento que se inyecta en la zona del hombro para adormecer toda la regin alrededor del lugar de la inyeccin (anestesia regional).  Se realizar una incisin en el frente del hombro, desde la (clavcula) hasta el punto en el que el msculo del hombro (deltoides) se inserta en el hmero.  El hmero se extraer de la cavidad para exponer el extremo con forma de bola de la parte superior del brazo.  La cavidad central del hmero se limpiar y agrandar para crear una zona hueca que coincida con la forma del vstago que se implantar. Se alisar el extremo superior del hueso de modo que el vstago se nivele con la superficie del hueso al insertarse.  Si la bola de la prtesis es una pieza separada, se seleccionar el tamao apropiado y se la sujetar.  Si la parte de la cavidad de la articulacin est sana y los msculos circundantes estn en buenas condiciones, el cirujano puede decidir no reemplazarla. No obstante, si es necesario reemplazar la cavidad: ? El cirujano extraer el cartlago daado restante a fin de preparar la superficie de la cavidad. ? El hueso de la cavidad se reformar suavemente para que se  adapte al implante. ? El componente glenoideo se implantar y se cementar en la posicin.  El hmero, con su nueva cabeza artificial, se colocar nuevamente en la cavidad. El cirujano volver a Camera operator los tendones de sostn y Radiographer, therapeutic incisin con suturas o puntos.  Se colocar una venda (vendaje) sobre la incisin.  Le colocarn un cabestrillo o inmovilizador en el brazo y una almohada de sostn debajo del codo.  Se colocarn tubos para extraer el exceso del drenaje. Por lo general, estos se retiran luego de un par [employer]. Este procedimiento puede variar segn el mdico y el hospital. Sander Nephew ocurre despus del procedimiento?  Le controlarn la presin arterial, la frecuencia cardaca, la frecuencia respiratoria y Retail buyer de oxgeno en la sangre hasta que haya desaparecido el efecto de los medicamentos administrados.  Se le adormecer el brazo si le administraron anestesia regional. Esto puede durar [employer] da siguiente.  Le darn analgsicos si los necesita.  El brazo y el hombro estarn rgidos y con hematomas. Esto mejorar con [employer].  Se colocar un dispositivo de hielo alrededor del hombro. Esto ayuda a Financial controller y la hinchazn.  Le colocarn un cabestrillo o inmovilizador en el brazo. Es posible que deba usar este dispositivo durante 2 a 4semanas despus de la [place], o segn las indicaciones del mdico.  El equipo de atencin mdica comenzar a mostrarle los ejercicios para el hombro.  No use el brazo para empujarse [place]  en la cama o de una silla.  No levante ningn objeto que pese ms que una taza de caf.  No conduzca durante 24horas si le administraron un sedante. Pregntele al mdico cundo es seguro volver a Forensic psychologist. Resumen  El reemplazo de la articulacin del hombro es una ciruga para Training and development officer las partes daadas de la articulacin del hombro con partes artificiales (prtesis).  En funcin del dao sufrido en el hombro, el cirujano  puede reemplazar solo la cabeza humeral (hemiartroplastia) o la cabeza humeral y la cavidad glenoidea (reemplazo total de hombro).  Tomar medicamentos, aplicar hielo en la zona dolorida y Field seismologist los ejercicios segn las indicaciones del mdico ayudar a Financial controller, la hinchazn y la rigidez en el hombro despus de la [place].  Despus del procedimiento, no levante ningn objeto que pese ms que una taza de caf y no use el brazo para empujarse hacia arriba en la cama o de una silla. Esta informacin no tiene Marine scientist el consejo del mdico. Asegrese de hacerle al mdico cualquier pregunta que tenga. Document Released: 11/07/2012 Document Revised: 10/12/2016 Document Reviewed: 08/16/2012 Elsevier Patient Education  [place].
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
## 2019-02-11 ENCOUNTER — Other Ambulatory Visit: Payer: Self-pay

## 2019-02-11 ENCOUNTER — Ambulatory Visit: Payer: Managed Care, Other (non HMO) | Admitting: Orthopedic Surgery

## 2019-02-11 ENCOUNTER — Encounter: Payer: Self-pay | Admitting: Orthopedic Surgery

## 2019-02-11 DIAGNOSIS — M12811 Other specific arthropathies, not elsewhere classified, right shoulder: Secondary | ICD-10-CM | POA: Diagnosis not present

## 2019-02-13 ENCOUNTER — Other Ambulatory Visit: Payer: Self-pay

## 2019-02-15 ENCOUNTER — Encounter: Payer: Self-pay | Admitting: Family Medicine

## 2019-02-15 ENCOUNTER — Other Ambulatory Visit: Payer: Self-pay

## 2019-02-15 ENCOUNTER — Ambulatory Visit: Payer: Managed Care, Other (non HMO) | Admitting: Family Medicine

## 2019-02-15 VITALS — BP 124/77 | HR 60 | Temp 98.4°F | Ht 65.0 in | Wt 191.0 lb

## 2019-02-15 DIAGNOSIS — G8929 Other chronic pain: Secondary | ICD-10-CM

## 2019-02-15 DIAGNOSIS — M25511 Pain in right shoulder: Secondary | ICD-10-CM

## 2019-02-15 DIAGNOSIS — M25512 Pain in left shoulder: Secondary | ICD-10-CM

## 2019-02-15 DIAGNOSIS — E1169 Type 2 diabetes mellitus with other specified complication: Secondary | ICD-10-CM | POA: Diagnosis not present

## 2019-02-15 DIAGNOSIS — E119 Type 2 diabetes mellitus without complications: Secondary | ICD-10-CM | POA: Diagnosis not present

## 2019-02-15 DIAGNOSIS — E785 Hyperlipidemia, unspecified: Secondary | ICD-10-CM | POA: Diagnosis not present

## 2019-02-15 LAB — BAYER DCA HB A1C WAIVED: HB A1C (BAYER DCA - WAIVED): 6.6 % (ref <7.0)

## 2019-02-15 MED ORDER — METFORMIN HCL 500 MG PO TABS: 500.0000 mg | ORAL_TABLET | Freq: Two times a day (BID) | ORAL | 1 refills | Status: DC

## 2019-02-15 NOTE — Progress Notes (Signed)
Subjective: CC: f/u DM2, HLD PCP: Janora Norlander, DO IZT:IWPYKD Enck is a 74 y.o. male presenting to clinic today for:  Video interpreter Cletus Gash, [place] used for Spanish translation of this visit  1. Type 2 Diabetes w/ microalbuminuria:  Patient reports compliance with metformin 500 mg BID, Lipitor 20  Last eye exam: Needs Sees Dr. Manuella Ghazi with [facility] Last foot exam: UTD Last A1c:  Lab Results  Component Value Date   HGBA1C 6.8 08/17/2018   Nephropathy screen indicated?: Yes Last flu, zoster and/or pneumovax:  Immunization History  Administered Date(s) Administered  . Hepatitis B 06/18/2017  . Hepatitis B, adult 07/21/2017  . Influenza, High Dose Seasonal PF 01/02/2018  . Influenza,inj,Quad PF,6+ Mos 11/06/2014, 11/03/2015, 11/23/2016  . Influenza-Unspecified 12/31/2013  . Pneumococcal Conjugate-13 06/18/2017  . Pneumococcal Polysaccharide-23 08/17/2018  . Td 09/05/1994, 11/13/2012  . Tdap 11/13/2012    Denies polyuria, polydipsia, unintended weight loss/gain, foot ulcerations, numbness or tingling in extremities, shortness of breath or chest pain.  2.  Chronic shoulder pain Patient reports bilateral chronic shoulder pain but this is well controlled with steroid injections.  He has had 2 in the right and one in the left.  He has been offered surgery but notes that he is not pursuing it at this time and symptoms are controlled.  ROS: Per HPI  No Known Allergies Past Medical History:  Diagnosis Date  . Diabetes mellitus without complication ([place])   . Hemorrhoids     Current Outpatient Medications:  .  atorvastatin (LIPITOR) 20 MG tablet, Take 1 tablet (20 mg total) by mouth daily., Disp: 90 tablet, Rfl: 3 .  glucosamine-chondroitin 500-400 MG tablet, Take 1 tablet by mouth 3 (three) times daily., Disp: , Rfl:  .  metFORMIN (GLUCOPHAGE) 500 MG tablet, Take 1 tablet (500 mg total) by mouth 2 (two) times daily with a meal., Disp: 180 tablet, Rfl: 1 .   clotrimazole (LOTRIMIN) 1 % cream, Apply 1 application topically 2 (two) times daily. (Patient not taking: Reported on 02/15/2019), Disp: 30 g, Rfl: 0   Social History   Socioeconomic History  . Marital status: Single    Spouse name: Not on file  . Number of children: Not on file  . Years of education: Not on file  . Highest education level: Not on file  Occupational History  . Not on file  Tobacco Use  . Smoking status: Former Research scientist (life sciences)  . Smokeless tobacco: Never Used  Substance and Sexual Activity  . Alcohol use: No  . Drug use: No  . Sexual activity: Not on file  Other Topics Concern  . Not on file  Social History Narrative  . Not on file   Social Determinants of Health   Financial Resource Strain:   . Difficulty of Paying Living Expenses: Not on file  Food Insecurity:   . Worried About Charity fundraiser in the Last Year: Not on file  . Ran Out of Food in the Last Year: Not on file  Transportation Needs:   . Lack of Transportation (Medical): Not on file  . Lack of Transportation (Non-Medical): Not on file  Physical Activity:   . Days of Exercise per Week: Not on file  . Minutes of Exercise per Session: Not on file  Stress:   . Feeling of Stress : Not on file  Social Connections:   . Frequency of Communication with Friends and Family: Not on file  . Frequency of Social Gatherings with Friends and Family:  Not on file  . Attends Religious Services: Not on file  . Active Member of Clubs or Organizations: Not on file  . Attends Archivist Meetings: Not on file  . Marital Status: Not on file  Intimate Partner Violence:   . Fear of Current or Ex-Partner: Not on file  . Emotionally Abused: Not on file  . Physically Abused: Not on file  . Sexually Abused: Not on file    Objective: Office vital signs reviewed. BP 124/77   Pulse 60   Temp 98.4 F (36.9 C) (Temporal)   Ht '5\' 5"'$  (1.651 m)   Wt 191 lb (86.6 kg)   SpO2 98%   BMI 31.78 kg/m   Physical  Examination:  General: Awake, alert, well nourished, No acute distress HEENT: Normal, sclera white Cardio: regular rate and rhythm, S1S2 heard, no murmurs appreciated Pulm: clear to auscultation bilaterally, no wheezes, rhonchi or rales; normal work of breathing on room air Extremities: warm, well perfused, No edema, cyanosis or clubbing; +2 pulses bilaterally  Assessment/ Plan: 74 y.o. male   1. Diabetes mellitus without complication ([place]) Under excellent control with A1c of 6.6 today.  Continue current regimen.  Refill sent - hgba1c - Microalbumin / creatinine urine ratio  2. Hyperlipidemia associated with type 2 diabetes mellitus ([place]) Doing well on the statin.  Check direct LDL and liver function tests.  Plan for fasting lipid panel at next visit - CMP14+EGFR - LDL Cholesterol, Direct  3. Chronic pain of both shoulders Stable status post corticosteroid injections with orthopedics.  He has been offered surgery but is not pursuing this at this time since symptoms are controlled with injections     Orders Placed This Encounter  Procedures  . hgba1c  . Microalbumin / creatinine urine ratio  . CMP14+EGFR  . LDL Cholesterol, Direct   Meds ordered this encounter  Medications  . metFORMIN (GLUCOPHAGE) 500 MG tablet    Sig: Take 1 tablet (500 mg total) by mouth 2 (two) times daily with a meal.    Dispense:  180 tablet    Refill:  Bear Grass, DO [place] [phone]

## 2019-02-16 LAB — CMP14+EGFR
ALT: 29 IU/L (ref 0–44)
AST: 25 IU/L (ref 0–40)
Albumin/Globulin Ratio: 1.5 (ref 1.2–2.2)
Albumin: 4.4 g/dL (ref 3.7–4.7)
Alkaline Phosphatase: 90 IU/L (ref 39–117)
BUN/Creatinine Ratio: 11 (ref 10–24)
BUN: 9 mg/dL (ref 8–27)
Bilirubin Total: 0.4 mg/dL (ref 0.0–1.2)
CO2: 22 mmol/L (ref 20–29)
Calcium: 9.2 mg/dL (ref 8.6–10.2)
Chloride: 101 mmol/L (ref 96–106)
Creatinine, Ser: 0.85 mg/dL (ref 0.76–1.27)
GFR calc Af Amer: 101 mL/min/{1.73_m2} (ref >59)
GFR calc non Af Amer: 88 mL/min/{1.73_m2} (ref >59)
Globulin, Total: 3 g/dL (ref 1.5–4.5)
Glucose: 107 mg/dL — ABNORMAL HIGH (ref 65–99)
Potassium: 4.2 mmol/L (ref 3.5–5.2)
Sodium: 138 mmol/L (ref 134–144)
Total Protein: 7.4 g/dL (ref 6.0–8.5)

## 2019-02-16 LAB — MICROALBUMIN / CREATININE URINE RATIO
Creatinine, Urine: 121 mg/dL
Microalb/Creat Ratio: 100 mg/g creat — ABNORMAL HIGH (ref 0–29)
Microalbumin, Urine: 121.6 ug/mL

## 2019-02-16 LAB — LDL CHOLESTEROL, DIRECT: LDL Direct: 76 mg/dL (ref 0–99)

## 2019-02-17 ENCOUNTER — Encounter: Payer: Self-pay | Admitting: Orthopedic Surgery
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
## 2019-08-09 ENCOUNTER — Other Ambulatory Visit: Payer: Self-pay | Admitting: Family Medicine

## 2019-08-16 ENCOUNTER — Ambulatory Visit: Payer: Managed Care, Other (non HMO) | Admitting: Family Medicine

## 2019-08-16 ENCOUNTER — Other Ambulatory Visit: Payer: Self-pay

## 2019-08-16 VITALS — BP 138/78 | HR 64 | Temp 98.0°F | Ht 65.0 in | Wt 188.0 lb

## 2019-08-16 DIAGNOSIS — E1129 Type 2 diabetes mellitus with other diabetic kidney complication: Secondary | ICD-10-CM | POA: Diagnosis not present

## 2019-08-16 DIAGNOSIS — R809 Proteinuria, unspecified: Secondary | ICD-10-CM | POA: Diagnosis not present

## 2019-08-16 DIAGNOSIS — M25562 Pain in left knee: Secondary | ICD-10-CM | POA: Diagnosis not present

## 2019-08-16 DIAGNOSIS — M25561 Pain in right knee: Secondary | ICD-10-CM

## 2019-08-16 DIAGNOSIS — G8929 Other chronic pain: Secondary | ICD-10-CM

## 2019-08-16 LAB — BAYER DCA HB A1C WAIVED: HB A1C (BAYER DCA - WAIVED): 7 % — ABNORMAL HIGH (ref <7.0)

## 2019-08-16 MED ORDER — DICLOFENAC SODIUM 1 % EX GEL: 4.0000 g | Freq: Four times a day (QID) | CUTANEOUS | 1 refills | Status: DC

## 2019-08-16 NOTE — Patient Instructions (Signed)
El azcar se ve bien. Contine con los medicamentos segn las indicaciones. El Dr. Marin Comment en [employer] en [place] lo ver a las 12 pm para sus ojos. Te di gel Voltaren para las rodillas. Est bien aplicar 4 veces al da para Conservation officer, historic buildings. La remisin al mdico de la rodilla est disponible. Ellos lo llamarn para programar la fecha de la cita.

## 2019-08-19 MED ORDER — METFORMIN HCL 500 MG PO TABS: 500.0000 mg | ORAL_TABLET | Freq: Two times a day (BID) | ORAL | 1 refills | Status: DC

## 2019-08-19 MED ORDER — ATORVASTATIN CALCIUM 20 MG PO TABS: 20.0000 mg | ORAL_TABLET | Freq: Every day | ORAL | 3 refills | Status: DC

## 2019-08-19 NOTE — Progress Notes (Signed)
Subjective: CC: f/u DM2, HLD PCP: Tom Norlander, DO Tom [name] is a 74 y.o. male presenting to clinic today for:  Video interpreter Tom [name], [place] used for Spanish translation of this visit  1. Type 2 Diabetes w/ microalbuminuria:  Patient reports compliance with metformin 500 mg BID, Lipitor 20.  He has no concerns with regards to his diabetes today.  Last eye exam: Needs Sees Dr. Manuella [name] with [facility] Last foot exam: UTD Last A1c:  Lab Results  Component Value Date   HGBA1C 7.0 (H) 08/16/2019   Nephropathy screen indicated?:  Up-to-date Last flu, zoster and/or pneumovax:  Immunization History  Administered Date(s) Administered  . Hepatitis B 06/18/2017  . Hepatitis B, adult 07/21/2017  . Influenza, High Dose Seasonal PF 01/02/2018  . Influenza,inj,Quad PF,6+ Mos 11/06/2014, 11/03/2015, 11/23/2016  . Influenza-Unspecified 12/31/2013  . Pneumococcal Conjugate-13 06/18/2017  . Pneumococcal Polysaccharide-23 08/17/2018  . Td 09/05/1994, 11/13/2012  . Tdap 11/13/2012    Denies no chest pain, shortness of breath, sensation changes.  He has had some eye irritation at the inner corners that he describes as itchiness, and eye strain after he looks at his phone.  Otherwise denies any visual disturbances.  2.  Chronic knee pain Patient reports about a 1-2 week flare in pain in his left knee.  He notes the pain is mild compared to previous times that required injection but he feels that the knee is unstable.  He states that sometimes it wants to buckle from underneath him.  He does not report taking any medications for this.  He is wondering if he needs a repeat injection for it.  Denies any swelling, locking or popping.  He is ambulating independently.  ROS: Per HPI  No Known Allergies Past Medical History:  Diagnosis Date  . Diabetes mellitus without complication ([place])   . Hemorrhoids     Current Outpatient Medications:  .  atorvastatin (LIPITOR) 20 MG  tablet, Take 1 tablet by mouth once daily, Disp: 90 tablet, Rfl: 0 .  glucosamine-chondroitin 500-400 MG tablet, Take 1 tablet by mouth 3 (three) times daily., Disp: , Rfl:  .  metFORMIN (GLUCOPHAGE) 500 MG tablet, Take 1 tablet (500 mg total) by mouth 2 (two) times daily with a meal., Disp: 180 tablet, Rfl: 1 .  diclofenac Sodium (VOLTAREN) 1 % GEL, Apply 4 g topically 4 (four) times daily. For knee pain  (SIG in spanish please), Disp: 400 g, Rfl: 1   Social History   Socioeconomic History  . Marital status: Single    Spouse name: Not on file  . Number of children: Not on file  . Years of education: Not on file  . Highest education level: Not on file  Occupational History  . Not on file  Tobacco Use  . Smoking status: Former Research scientist (life sciences)  . Smokeless tobacco: Never Used  Vaping Use  . Vaping Use: Never used  Substance and Sexual Activity  . Alcohol use: No  . Drug use: No  . Sexual activity: Not on file  Other Topics Concern  . Not on file  Social History Narrative  . Not on file   Social Determinants of Health   Financial Resource Strain:   . Difficulty of Paying Living Expenses:   Food Insecurity:   . Worried About Charity fundraiser in the Last Year:   . Arboriculturist in the Last Year:   Transportation Needs:   . Film/video editor (Medical):   Marland Kitchen  Lack of Transportation (Non-Medical):   Physical Activity:   . Days of Exercise per Week:   . Minutes of Exercise per Session:   Stress:   . Feeling of Stress :   Social Connections:   . Frequency of Communication with Friends and Family:   . Frequency of Social Gatherings with Friends and Family:   . Attends Religious Services:   . Active Member of Clubs or Organizations:   . Attends Archivist Meetings:   Marland Kitchen Marital Status:   Intimate Partner Violence:   . Fear of Current or Ex-Partner:   . Emotionally Abused:   Marland Kitchen Physically Abused:   . Sexually Abused:     Objective: Office vital signs reviewed. BP  138/78   Pulse 64   Temp 98 F (36.7 C) (Temporal)   Ht 5\' 5"  (1.651 m)   Wt 188 lb (85.3 kg)   SpO2 95%   BMI 31.28 kg/m   Physical Examination:  General: Awake, alert, well nourished, No acute distress HEENT: Normal, sclera slightly injected; pterygium noted Cardio: regular rate and rhythm, S1S2 heard, no murmurs appreciated Pulm: clear to auscultation bilaterally, no wheezes, rhonchi or rales; normal work of breathing on room air Extremities: warm, well perfused, No edema, cyanosis or clubbing; +2 pulses bilaterally MSK: No gross joint effusions, erythema or warmth.  He is ambulating independently.  Assessment/ Plan: 74 y.o. male   1. Diabetes mellitus with microalbuminuria, without long-term use of insulin ([facility]) A1c borderline today.  It was 7.0.  Continue current regimen.  Follow-up in 3 months, sooner if needed.  I have arranged an eye appointment for him at noon today with [name] at [facility]. ?  Dry eyes versus allergy causing the irritation in his eyes.  He is due for diabetic eye exam anyways. - Bayer DCA Hb A1c Waived  2. Chronic pain of both knees Given lack of severe pain do not think that corticosteroid injection is appropriate for this patient today.  I recommended topical Voltaren gel up to 4 times daily.  I am going to place a referral to the orthopedist to see if perhaps we can get him in for knee evaluation.  I wonder if you would benefit from viscosupplementation.  Knee instability does make me worry about possible meniscal involvement. - diclofenac Sodium (VOLTAREN) 1 % GEL; Apply 4 g topically 4 (four) times daily. For knee pain  (SIG in spanish please)  Dispense: 400 g; Refill: 1    Orders Placed This Encounter  Procedures  . Bayer DCA Hb A1c Waived  . Ambulatory referral to Orthopedic Surgery    Referral Priority:   Routine    Referral Type:   Surgical    Referral Reason:   Specialty Services Required    Requested Specialty:   Orthopedic Surgery    Number  of Visits Requested:   1   Meds ordered this encounter  Medications  . diclofenac Sodium (VOLTAREN) 1 % GEL    Sig: Apply 4 g topically 4 (four) times daily. For knee pain  (SIG in spanish please)    Dispense:  400 g    Refill:  1  . metFORMIN (GLUCOPHAGE) 500 MG tablet    Sig: Take 1 tablet (500 mg total) by mouth 2 (two) times daily with a meal.    Dispense:  180 tablet    Refill:  1  . atorvastatin (LIPITOR) 20 MG tablet    Sig: Take 1 tablet (20 mg total) by mouth daily.  Dispense:  90 tablet    Refill:  [place], [place] [phone]

## 2019-08-27 ENCOUNTER — Ambulatory Visit: Payer: Managed Care, Other (non HMO) | Admitting: Orthopaedic Surgery

## 2019-09-17 ENCOUNTER — Other Ambulatory Visit: Payer: Self-pay

## 2019-09-17 ENCOUNTER — Encounter: Payer: Self-pay | Admitting: Orthopaedic Surgery

## 2019-09-17 ENCOUNTER — Ambulatory Visit: Payer: Managed Care, Other (non HMO)

## 2019-09-17 ENCOUNTER — Ambulatory Visit: Payer: Managed Care, Other (non HMO) | Admitting: Orthopaedic Surgery

## 2019-09-17 VITALS — BP 131/73 | HR 72 | Ht 65.0 in | Wt 191.0 lb

## 2019-09-17 DIAGNOSIS — M25561 Pain in right knee: Secondary | ICD-10-CM | POA: Diagnosis not present

## 2019-09-17 DIAGNOSIS — M25562 Pain in left knee: Secondary | ICD-10-CM

## 2019-09-17 DIAGNOSIS — G8929 Other chronic pain: Secondary | ICD-10-CM

## 2019-09-17 NOTE — Patient Instructions (Signed)
Note off work today and tomorrow, return Thursday.
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
## 2019-10-08 ENCOUNTER — Ambulatory Visit: Payer: Managed Care, Other (non HMO) | Admitting: Orthopaedic Surgery

## 2019-10-22 ENCOUNTER — Encounter: Payer: Self-pay | Admitting: Orthopaedic Surgery

## 2019-10-22 ENCOUNTER — Other Ambulatory Visit: Payer: Self-pay

## 2019-10-22 ENCOUNTER — Ambulatory Visit (INDEPENDENT_AMBULATORY_CARE_PROVIDER_SITE_OTHER): Payer: Managed Care, Other (non HMO) | Admitting: Orthopaedic Surgery

## 2019-10-22 VITALS — BP 138/80 | HR 70 | Ht 65.0 in

## 2019-10-22 DIAGNOSIS — M25562 Pain in left knee: Secondary | ICD-10-CM | POA: Diagnosis not present

## 2019-10-22 DIAGNOSIS — G8929 Other chronic pain: Secondary | ICD-10-CM

## 2019-10-22 NOTE — Patient Instructions (Signed)
Resonancia Training and development officer Resonance Imaging La Health visitor (RM) es un estudio de diagnstico por imgenes indoloro que genera imgenes del interior del organismo sin usar rayosX. Durante una RM, imanes potentes y [place] de radio se [place] en un campo magntico para crear imgenes detalladas. Las imgenes de una RM pueden brindar ms detalles sobre una afeccin mdicas que las radiografas, las exploraciones por tomografa computarizada (TC) y las ecografas. Para una RM estndar, deber Arsenio Loader en una plataforma que se desliza dentro de un tnel. El tnel contiene imanes que escanean su cuerpo. Si tiene una RM [place], el tnel estar [facility]. En algunos casos, se puede inyectar un tinte (sustancia de contraste o tinte de [place]) en el torrente sanguneo para que las imgenes de la RM sean incluso ms claras. Informe al mdico acerca de lo siguiente:  Cirugas a las que se someti.  Cualquier afeccin mdica que tenga.  Cualquier pieza de metal que tenga en el organismo. El imn utilizado en la RM puede hacer que los objetos metlicos de su cuerpo se [place]. Adems, el metal puede dificultar la obtencin de imgenes de alta calidad. Los objetos que pueden contener metal incluyen: ? Merchandiser, retail de Risk analyst (prtesis), como una rodilla o cadera artificial. ? Un desfibrilador implantado, marcapasos o neuroestimulador. ? Un implante metlico de odo (implante coclear). ? Una vlvula cardaca artificial. ? Un objeto metlico en el ojo. ? Esquirlas de metal. ? Fragmentos de bala. ? Un puerto para la administracin de insulina o quimioterapia.  Cualquier tatuaje. Algunas de las tintas ms oscuras pueden causar problemas con [place].  Si est embarazada o podra estarlo, o si est amamantando.  Todo miedo a los espacios cerrados (claustrofobia). Si esto es un problema, generalmente puede controlarse con medicamentos administrados antes de la  RM.  Cualquier alergia que tenga.  Todos los Walt Disney, incluidos vitaminas, hierbas, gotas oftlmicas, cremas y [address] de [address]. Cules son los riesgos? Por lo general, se trata de un estudio seguro. Sin embargo, pueden surgir problemas:  Si tiene metal en el cuerpo, puede verse afectado por el imn utilizado durante el [place]. Si tiene un implante metlico cerca de la zona que se est examinando, puede ser difcil obtener imgenes de alta calidad.  Si est embarazada, debe evitar los estudios por RM, [employer] primeros tres meses de [place]. La RM puede tener efectos en el beb en gestacin.  Si est amamantando y se usar sustancia de contraste durante el [place], es posible que deba dejar de Museum/gallery exhibitions officer por un tiempo. La leche materna podra contener sustancia de contraste hasta que el cuerpo elimine naturalmente esa sustancia. Qu ocurre antes del procedimiento?  Se le pedir que se quite todos los objetos de metal, incluidos: ? El reloj, las [place] y otros objetos de metal. ? Audfonos. ? Dentaduras postizas. ? Sostn con aro. ? Maquillaje. Ciertos tipos de maquillaje contienen pequeas cantidades de metal. ? Los aparatos de ortodoncia y las emplomaduras no son un problema.  Si est amamantando, pregntele al mdico cundo debe extraerse [employer] del estudio y dejar de Museum/gallery exhibitions officer de forma temporal. Tal vez deba hacerlo si se utilizar sustancia de [place]. Qu ocurre durante el procedimiento?   Pueden darle auriculares o audfonos para que escuche msica. La mquina de RM puede ser ruidosa.  Usted estar [employer], similar a Physiological scientist.  Si se Agricultural consultant de Arts development officer, Musician una va intravenosa en una de las venas. El material  de Information systems manager en la va intravenosa en un cierto momento a medida que se toman imgenes.  La plataforma se deslizar hacia dentro de un tnel que tiene imanes en su  interior. Una vez dentro del tnel, podr seguir hablando con el mdico.  Le pedirn que permanezca muy quieto mientras se toman las imgenes. El mdico le dir cundo puede moverse. Tal vez deba esperar unos minutos hasta tanto se compruebe que las imgenes tomadas durante el estudio pueden [place].  Cuando se hayan generado todas las imgenes, la plataforma se deslizar hacia afuera del tnel. Este procedimiento puede variar segn el mdico y el hospital. Sander Nephew ocurre despus del procedimiento?  Es posible que lo trasladen a un rea de recuperacin si se utilizaron sedantes. Le controlarn la presin arterial, la frecuencia cardaca, la frecuencia respiratoria y Retail buyer de oxgeno en la sangre hasta que le den el alta del hospital o de la clnica.  Si se [place] una sustancia de contraste: ? Se eliminar del cuerpo a travs de la orina en el trmino de Optician, dispensing. Tal vez le indiquen que beba gran cantidad de lquido para ayudar a eliminar la sustancia de contraste de su organismo. ? Si est amamantando, no amamante a su hijo hasta que el mdico le indique que es [place].  Es posible que pueda retomar sus actividades normales de inmediato o como se lo indique el mdico.  Depende de usted obtener los [place] del [place]. Consulte al mdico o pregunte en el departamento donde se realiza la prueba cundo estarn [employer]. Resumen  La Health visitor (RM) es un estudio de diagnstico por imgenes indoloro que genera imgenes detalladas del interior del organismo sin usar rayosX. En cambio, imanes poderosos y [place] de radio se [place] en un campo magntico para crear imgenes muy detalladas y ntidas.  Es posible que se inyecte un material de [place], tambin denominado tinte de [place], en su cuerpo para que las imgenes de la RM sean incluso ms claras.  Antes de la RM, asegrese de informar al mdico sobre cualquier objeto metlico que pueda tener en el cuerpo.  Hable  con su mdico sobre lo que significan los resultados del estudio. Esta informacin no tiene Marine scientist el consejo del mdico. Asegrese de hacerle al mdico cualquier pregunta que tenga. Document Revised: 02/25/2017 Document Reviewed: 02/25/2017 Elsevier Patient Education  2020 [employer].
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
## 2019-10-22 NOTE — Progress Notes (Signed)
[place] for work note from missed work today.

## 2019-10-31 ENCOUNTER — Ambulatory Visit (HOSPITAL_COMMUNITY)
Admission: RE | Admit: 2019-10-31 | Discharge: 2019-10-31 | Disposition: A | Discharge status: Home / Self Care | Payer: Managed Care, Other (non HMO) | Source: Ambulatory Visit | Attending: Orthopaedic Surgery | Admitting: Orthopaedic Surgery

## 2019-10-31 ENCOUNTER — Other Ambulatory Visit: Payer: Self-pay

## 2019-10-31 DIAGNOSIS — M25562 Pain in left knee: Secondary | ICD-10-CM | POA: Insufficient documentation

## 2019-10-31 DIAGNOSIS — G8929 Other chronic pain: Secondary | ICD-10-CM | POA: Insufficient documentation

## 2019-11-09 ENCOUNTER — Other Ambulatory Visit: Payer: Self-pay | Admitting: Family Medicine

## 2019-11-12 ENCOUNTER — Encounter: Payer: Self-pay | Admitting: Orthopaedic Surgery

## 2019-11-12 ENCOUNTER — Other Ambulatory Visit: Payer: Self-pay

## 2019-11-12 ENCOUNTER — Ambulatory Visit (INDEPENDENT_AMBULATORY_CARE_PROVIDER_SITE_OTHER): Payer: Managed Care, Other (non HMO) | Admitting: Orthopaedic Surgery

## 2019-11-12 VITALS — BP 150/76 | HR 70 | Ht 65.0 in | Wt 191.0 lb

## 2019-11-12 DIAGNOSIS — S83242D Other tear of medial meniscus, current injury, left knee, subsequent encounter: Secondary | ICD-10-CM
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
## 2019-11-12 NOTE — Patient Instructions (Signed)
Ruptura de meniscos Meniscus Tear  La ruptura de meniscos es una lesin en la rodilla que se produce cuando se rompe un fragmento del menisco. El menisco es un cartlago grueso y gomoso en forma de cua que se encuentra en la rodilla. En cada rodilla hay dos meniscos. Se alojan entre el hueso superior (fmur) y el inferior (tibia) que forman la articulacin de la rodilla. Cada menisco acta como un amortiguador de la rodilla. La ruptura de meniscos es uno de los tipos de lesiones ms frecuentes de la rodilla. Puede ser leve o grave. Para reparar Ardelia Mems rotura grave, tal vez haya que realizar Clementeen Hoof. Cules son las causas? Cualquier movimiento puede causar esta afeccin, por ejemplo, arrodillarse, ponerse en cuclillas, rotar o girar [employer]. Por lo general, las rupturas de meniscos suelen se causadas por lesiones deportivas. Estas suelen producirse al:  Optometrist y detenerse de [place] repentina. ? Cambiar de direccin. ? Recibir un tacle o ser derribado.  Levantar o cargar objetos pesados. A medida que las personas envejecen, los meniscos se vuelven ms finos y ms dbiles. En estas personas, las rupturas pueden ocurrir con ms facilidad, por ejemplo, al subir escaleras. Qu incrementa el riesgo? Es ms probable que tenga esta afeccin si:  Practica deportes de contacto.  Tiene un trabajo que requiere arrodillarse o ponerse en cuclillas.  Es hombre.  Es mayor de 40aos. Cules son los signos o los sntomas? Los sntomas de esta afeccin incluyen:  Dolor en la rodilla, especialmente al costado de la articulacin. Puede sentir dolor al producirse la lesin o solo escuchar un estallido y sentir el dolor ms tarde.  Sensacin de que la rodilla cruje, se engancha, se traba o se afloja.  Imposibilidad de flexionar o extender la rodilla por completo.  Moretones o hinchazn en la rodilla. Cmo se diagnostica? Esta afeccin se puede diagnosticar en funcin de los sntomas y de  un examen fsico. Tambin pueden hacerle estudios, por ejemplo:  Radiografas.  Resonancia magntica (RM).  Un procedimiento para examinar el interior de la rodilla con un pequeo endoscopio quirrgico (artroscopa). Pueden derivarlo a un especialista en rodilla (cirujano ortopdico). Cmo se trata? El tratamiento depende de la gravedad de la lesin. El tratamiento para una rotura leve puede incluir lo siguiente:  Reposo.  Medicamentos para Best boy y reducir la hinchazn. Generalmente, se trata de un antiinflamatorio no esteroideo (AINE), como ibuprofeno.  Un dispositivo ortopdico, una rodillera o una venda.  Usar [employer] o un andador para quitar peso de la rodilla y ayudarlo a Writer.  Ejercicios para fortalecer la rodilla (fisioterapia). Tal vez deba someterse a una ciruga si la ruptura es grave o si otros tratamientos no resultan eficaces. Siga estas indicaciones en su casa: Si tiene un dispositivo ortopdico, una rodillera o una venda:  selos como se lo haya indicado el mdico. Quteselos solamente como se lo haya indicado el mdico.  Afloje el dispositivo ortopdico, rodillera o venda si los dedos de los pies se le adormecen, siente hormigueos o se le enfran y se tornan de Optician, dispensing.  Mantenga limpio y seco el dispositivo ortopdico, rodillera o venda.  Si el dispositivo ortopdico, rodillera o venda no es impermeable: ? No deje que se moje. ? Cbralo con un envoltorio hermtico cuando tome un bao de inmersin o [place]. Control del dolor y [place] los medicamentos de venta libre y los recetados solamente como se lo haya indicado el mdico.  Si se lo  indican, aplique hielo sobre la rodilla: ? Si tiene un dispositivo ortopdico, rodillera o venda desmontable, quteselo como se lo haya indicado el mdico. ? Ponga el hielo en una bolsa plstica. ? Coloque una [employer] piel y Therapist, nutritional. ? Coloque el hielo durante 9minutos,  2a3veces al da.  Mueva los dedos del pie con frecuencia para evitar que se pongan rgidos y para reducir la hinchazn.  Cuando est sentado o acostado, levante (eleve) la zona lesionada por encima del nivel del corazn. Actividad  No apoye el peso del cuerpo sobre la extremidad [employer] que lo autorice el mdico. Use muletas o un andadorcomo se lo haya indicado el mdico.  Retome sus actividades normales como se lo haya indicado el mdico. Pregntele al mdico qu actividades son seguras para usted.  Haga ejercicios de flexibilidad solamente como se lo haya indicado el mdico.  Comience a Field seismologist los ejercicios para fortalecer la rodilla y los msculos de la pierna solamente como se lo haya indicado el mdico. Despus de que se recupere, el mdico puede recomendarle estos ejercicios para ayudar a Product/process development scientist que se produzca otra lesin. Indicaciones generales  Use un dispositivo ortopdico, una rodillera o una venda como se lo haya indicado el mdico.  Pregntele al mdico cundo puede volver a conducir si tiene un dispositivo ortopdico, una rodillera o una venda en la pierna.  No consuma ningn producto que contenga nicotina o tabaco, como cigarrillos, cigarrillos electrnicos y tabaco de Higher education careers adviser. Si necesita ayuda para dejar de fumar, consulte al [employer].  Pregntele al mdico si el medicamento recetado: ? Hace que sea necesario no conducir ni usar maquinaria pesada. ? Puede causarle estreimiento. Es posible que tenga que tomar estas medidas para prevenir o tratar el estreimiento:  Electronics engineer suficiente lquido como para Theatre manager la orina de color amarillo plido.  Tomar medicamentos recetados o de [employer].  Consumir alimentos ricos en fibra, como frijoles, cereales integrales, y frutas y verduras frescas.  Limitar el consumo de alimentos ricos en grasa y azcares procesados, como los alimentos fritos o dulces.  Concurra a todas las visitas de seguimiento como se lo haya  indicado el mdico. Esto es importante. Comunquese con un mdico si:  Tiene fiebre.  La rodilla est enrojecida, hinchada o le duele.  El medicamento no Production designer, theatre/television/film.  Los sntomas empeoran o no mejoran despus de 2semanas de Multimedia programmer. Resumen  La ruptura de meniscos es una lesin en la rodilla que se produce cuando se rompe un fragmento del menisco.  El tratamiento depende de la gravedad de la lesin. Tal vez deba someterse a una ciruga si la ruptura es grave o si otros tratamientos no resultan eficaces.  Haga reposo, aplquese hielo y levante (eleve) la rodilla lesionada como se lo haya indicado el mdico. Esto ayudar a Therapist, occupational y la inflamacin.  Comunquese con un mdico si tiene sntomas nuevos o si sus sntomas empeoran o no mejoran despus de 2 semanas de cuidados en el hogar.  Concurra a todas las visitas de seguimiento como se lo haya indicado el mdico. Esto es importante. Esta informacin no tiene Marine scientist el consejo del mdico. Asegrese de hacerle al mdico cualquier pregunta que tenga. Document Revised: 09/20/2017 Document Reviewed: 09/20/2017 Elsevier Patient Education  2020 [employer].

## 2019-11-15 ENCOUNTER — Encounter: Payer: Self-pay | Admitting: Orthopedic Surgery

## 2019-11-15 ENCOUNTER — Other Ambulatory Visit: Payer: Self-pay

## 2019-11-15 ENCOUNTER — Ambulatory Visit (INDEPENDENT_AMBULATORY_CARE_PROVIDER_SITE_OTHER): Payer: Managed Care, Other (non HMO) | Admitting: Orthopedic Surgery

## 2019-11-15 VITALS — BP 144/81 | HR 64 | Ht 65.0 in

## 2019-11-15 DIAGNOSIS — M1712 Unilateral primary osteoarthritis, left knee: Secondary | ICD-10-CM | POA: Diagnosis not present

## 2019-11-15 MED ORDER — DICLOFENAC-MISOPROSTOL 50-0.2 MG PO TBEC: 1.0000 | DELAYED_RELEASE_TABLET | Freq: Two times a day (BID) | ORAL | 0 refills | Status: DC
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
## 2019-11-15 NOTE — Progress Notes (Signed)
[facility]-scribe was down at this time and I called in a prescription for this patient to [facility] per Dr. Amedeo Kinsman.

## 2020-01-02 DIAGNOSIS — R52 Pain, unspecified: Secondary | ICD-10-CM | POA: Diagnosis not present

## 2020-01-02 DIAGNOSIS — R519 Headache, unspecified: Secondary | ICD-10-CM | POA: Diagnosis not present

## 2020-01-02 DIAGNOSIS — J0100 Acute maxillary sinusitis, unspecified: Secondary | ICD-10-CM | POA: Diagnosis not present

## 2020-01-02 DIAGNOSIS — R5383 Other fatigue: Secondary | ICD-10-CM | POA: Diagnosis not present

## 2020-02-09 ENCOUNTER — Other Ambulatory Visit: Payer: Self-pay | Admitting: Family Medicine

## 2020-02-19 NOTE — Progress Notes (Signed)
Subjective: CC: DM PCP: Janora Norlander, DO YME:BRAXEN Zanetti is a 75 y.o. male presenting to clinic today for:  [employer] video interpreter Herbie [place], [place] used for spanish translation of this visit   1. Type 2 Diabetes with hypertension, hyperlipidemia:  Patient does not routinely monitor blood sugars.  He has been feeling well though.  Denies any change in vision, dizziness, loss of consciousness, chest pain or shortness of breath.  No sensory changes in the feet.  No foot ulcers.  Last eye exam: needs Last foot exam: needs Last A1c:  Lab Results  Component Value Date   HGBA1C 7.0 (H) 08/16/2019   Nephropathy screen indicated?: needs Last flu, zoster and/or pneumovax:  Immunization History  Administered Date(s) Administered  . Hepatitis B 06/18/2017  . Hepatitis B, adult 07/21/2017  . Influenza, High Dose Seasonal PF 01/02/2018  . Influenza,inj,Quad PF,6+ Mos 11/06/2014, 11/03/2015, 11/23/2016  . Influenza-Unspecified 12/31/2013  . Pneumococcal Conjugate-13 06/18/2017  . Pneumococcal Polysaccharide-23 08/17/2018  . Td 09/05/1994, 11/13/2012  . Tdap 11/13/2012    2. penile pain Patient reports about a 1 month history of penile swelling, erythema and pain.  He notes he is unable to retract his foreskin backwards without significant pain.  Denies any sexual encounters in over 5 years.  He denies any penile discharge, scrotal swelling, nausea, vomiting or fevers.  He is urinating only slightly more often during the nighttime.  He is able to urinate but does not feel that his stream is normal.  No treatments for this thus far.    ROS: Per HPI  No Known Allergies Past Medical History:  Diagnosis Date  . Diabetes mellitus without complication ([place])   . Hemorrhoids     Current Outpatient Medications:  .  atorvastatin (LIPITOR) 20 MG tablet, Take 1 tablet by mouth once daily, Disp: 90 tablet, Rfl: 0 .  diclofenac Sodium (VOLTAREN) 1 % GEL, Apply 4 g topically 4  (four) times daily. For knee pain  (SIG in spanish please), Disp: 400 g, Rfl: 1 .  Diclofenac-miSOPROStol 50-0.2 MG TBEC, Take 1 tablet by mouth 2 (two) times daily., Disp: 60 tablet, Rfl: 0 .  glucosamine-chondroitin 500-400 MG tablet, Take 1 tablet by mouth 3 (three) times daily., Disp: , Rfl:  .  metFORMIN (GLUCOPHAGE) 500 MG tablet, Take 1 tablet (500 mg total) by mouth 2 (two) times daily with a meal., Disp: 180 tablet, Rfl: 1 Social History   Socioeconomic History  . Marital status: Single    Spouse name: Not on file  . Number of children: Not on file  . Years of education: Not on file  . Highest education level: Not on file  Occupational History  . Not on file  Tobacco Use  . Smoking status: Former Research scientist (life sciences)  . Smokeless tobacco: Never Used  Vaping Use  . Vaping Use: Never used  Substance and Sexual Activity  . Alcohol use: No  . Drug use: No  . Sexual activity: Not on file  Other Topics Concern  . Not on file  Social History Narrative  . Not on file   Social Determinants of Health   Financial Resource Strain: Not on file  Food Insecurity: Not on file  Transportation Needs: Not on file  Physical Activity: Not on file  Stress: Not on file  Social Connections: Not on file  Intimate Partner Violence: Not on file   Family History  Problem Relation Age of Onset  . Cancer Mother   . Heart Problems Father   .  Stomach cancer Brother     Objective: Office vital signs reviewed. BP 118/72   Pulse 76   Temp 98.1 F (36.7 C) (Temporal)   Ht _0  (1.651 m)   Wt 191 lb (86.6 kg)   SpO2 96%   BMI 31.78 kg/m   Physical Examination:  General: Awake, alert, well nourished, No acute distress HEENT: Normal, MMM Cardio: regular rate and rhythm, S1S2 heard, no murmurs appreciated Pulm: clear to auscultation bilaterally, no wheezes, rhonchi or rales; normal work of breathing on room air GU: He has penile swelling and a cheesy white discharge noted over the glans.  There is  no active discharge from the urethra.  The glans is visible. Extremities: warm, well perfused, No edema, cyanosis or clubbing; +2 pulses bilaterally Neuro: see DM foot  Diabetic Foot Exam - Simple   Simple Foot Form Diabetic Foot exam was performed with the following findings: Yes 02/21/2020 10:57 AM  Visual Inspection No deformities, no ulcerations, no other skin breakdown bilaterally: Yes Sensation Testing Intact to touch and monofilament testing bilaterally: Yes Pulse Check Posterior Tibialis and Dorsalis pulse intact bilaterally: Yes Comments     Assessment/ Plan: 75 y.o. male   Type 2 diabetes mellitus with microalbuminuria, without long-term current use of insulin (HCC) - Plan: Bayer DCA Hb A1c Waived, Microalbumin / creatinine urine ratio  Hyperlipidemia associated with type 2 diabetes mellitus ([place]) - Plan: CMP14+EGFR, Lipid Panel  Encounter for hepatitis C screening test for low risk patient - Plan: Hepatitis C antibody  Balanitis - Plan: clotrimazole-betamethasone (LOTRISONE) cream, amoxicillin-clavulanate (AUGMENTIN) 875-125 MG tablet, Ambulatory referral to Urology  Dysuria - Plan: Urinalysis, Complete  Sugar is controlled but borderline.  I like to see him back in 3 to 4 months for recheck  Urine microalbumin and urinalysis were ordered today but unfortunately patient did not label his bottle and this had to be disposed of.  We will plan to get this again at next visit  Regardless I am empirically treating him with oral antibiotics as well as topicals for what looks to be a balanitis that is candidal in nature.  I have also placed a referral to urology, particularly if symptoms continue to recur or do not totally resolve.  We discussed red flag signs symptoms warranting further evaluation emergency department.  He voiced good understanding will follow-up as needed re: this issue No orders of the defined types were placed in this encounter.  No orders of the defined  types were placed in this encounter.    Janora Norlander, DO [place] [phone]

## 2020-02-21 ENCOUNTER — Ambulatory Visit (INDEPENDENT_AMBULATORY_CARE_PROVIDER_SITE_OTHER): Payer: BC Managed Care – PPO | Admitting: Family Medicine

## 2020-02-21 ENCOUNTER — Other Ambulatory Visit: Payer: Self-pay

## 2020-02-21 VITALS — BP 118/72 | HR 76 | Temp 98.1°F | Ht 65.0 in | Wt 191.0 lb

## 2020-02-21 DIAGNOSIS — E1129 Type 2 diabetes mellitus with other diabetic kidney complication: Secondary | ICD-10-CM

## 2020-02-21 DIAGNOSIS — E1169 Type 2 diabetes mellitus with other specified complication: Secondary | ICD-10-CM

## 2020-02-21 DIAGNOSIS — Z1159 Encounter for screening for other viral diseases: Secondary | ICD-10-CM

## 2020-02-21 DIAGNOSIS — R809 Proteinuria, unspecified: Secondary | ICD-10-CM

## 2020-02-21 DIAGNOSIS — N481 Balanitis: Secondary | ICD-10-CM

## 2020-02-21 DIAGNOSIS — E785 Hyperlipidemia, unspecified: Secondary | ICD-10-CM | POA: Diagnosis not present

## 2020-02-21 DIAGNOSIS — R300 Dysuria: Secondary | ICD-10-CM

## 2020-02-21 LAB — BAYER DCA HB A1C WAIVED: HB A1C (BAYER DCA - WAIVED): 7 % — ABNORMAL HIGH (ref <7.0)

## 2020-02-21 MED ORDER — CLOTRIMAZOLE-BETAMETHASONE 1-0.05 % EX CREA: 1.0000 "application " | TOPICAL_CREAM | Freq: Two times a day (BID) | CUTANEOUS | 0 refills | Status: DC

## 2020-02-21 MED ORDER — AMOXICILLIN-POT CLAVULANATE 875-125 MG PO TABS: 1.0000 | ORAL_TABLET | Freq: Two times a day (BID) | ORAL | 0 refills | Status: DC

## 2020-02-21 NOTE — Patient Instructions (Signed)
Ferri's Clinical Advisor 2018 (pp. [id number].[id number]). [place], [place]: Elsevier.">  Balanitis Balanitis  La balanitis es la hinchazn e irritacin de la cabeza del pene (glande). La balanitis se produce ms frecuentemente entre hombres a los que no se les ha quitado el prepucio (no circuncidados). En los hombres no circuncidados, la afeccin tambin puede causar inflamacin de la piel alrededor del prepucio. La balanitis a veces causa cicatrices en el pene o el prepucio que pueden requerir ciruga. Esta afeccin puede ocurrir debido a una infeccin o a causa de otra afeccin. Si no se trata, la balanitis puede aumentar el riesgo del cncer de pene. Cules son las causas? Las causas ms frecuentes de esta afeccin incluyen las siguientes:  Mala higiene personal, especialmente en los hombres no circuncidados. No limpiar el glande ni el prepucio puede ocasionar la acumulacin de bacterias, virus y hongos, lo que puede provocar infeccin e inflamacin.  Irritacin y falta de circulacin de aire debido al lquido (esmegma) que puede acumularse en el glande. Otras causas son las siguientes:  Irritacin qumica por productos como jabones o geles de bao, especialmente aquellos que tienen fragancia. La irritacin qumica tambin puede ser causada por condones, lubricantes personales, vaselina, espermicidas o suavizantes para la ropa.  Enfermedades de la piel, como el eczema, la dermatitis y la psoriasis.  Alergias a medicamentos, como tetraciclina y sulfamidas. Qu incrementa el riesgo? Los siguientes factores pueden hacer que sea ms propenso a contraer esta afeccin:  No estar circuncidado.  Tener diabetes.  Tener otras enfermedades, como la cirrosis heptica, insuficiencia cardaca congestiva y enfermedad renal.  Tener infecciones tales como candidiasis, VPH (virus del papiloma humano), herpes simple, gonorrea o sfilis.  Tener un prepucio apretado que es difcil de tirar hacia atrs (retraer)  hasta pasar el glande.  Ser extremadamente obeso. Cules son los signos o sntomas? Los sntomas de esta afeccin incluyen:  Secrecin de debajo del prepucio y dolor o dificultad para retraer el prepucio.  Mal olor o picazn en el pene.  Dolor a la palpacin, enrojecimiento e hinchazn del glande.  Erupcin o llagas en el glande o el prepucio.  Incapacidad de tener una ereccin a causa del dolor.  Dificultad para orinar.  Cicatrices en el pene o el prepucio, en algunos casos. Cmo se diagnostica? Esta afeccin se puede diagnosticar mediante un examen fsico y anlisis de un hisopado de secrecin para comprobar si hay infeccin por hongos o bacteriana. Tambin pueden hacerle anlisis de sangre para detectar lo siguiente:  Virus que pueden causar balanitis.  Un nivel alto de azcar en la sangre (glucosa). Esto podra ser un signo de diabetes, que puede aumentar el riesgo de balanitis. Cmo se trata? El tratamiento para la balanitis depende de la causa. El tratamiento puede incluir:  Mejorar la higiene personal. El mdico podra recomendarle que tome baos con la cadera y las nalgas sumergidas en agua tibia (baos de asiento).  Tomar medicamentos, por ejemplo: ? Cremas y ungentos para reducir la hinchazn (corticoesteroides) o para tratar una infeccin. ? Antibiticos. ? Medicamentos antimicticos.  Una ciruga para extirpar o cortar el prepucio (circuncisin). Esto puede hacerse si tiene cicatrices en el prepucio que dificultan tirarlo hacia atrs.  Controlar otros problemas mdicos que puedan estar causando su afeccin o empeorndola. Siga estas instrucciones en su casa: Medicamentos  Tome los medicamentos de venta libre y los recetados solamente como se lo haya indicado el mdico.  Si le recetaron un antibitico, una crema o un ungento, tmelo o aplqueselo como se lo haya   indicado el mdico. No interrumpa el medicamento, la crema o el ungento aun si se siente  mejor. Instrucciones generales  No tenga relaciones sexuales hasta que la afeccin se cure o el mdico lo autorice.  Mantenga el pene limpio y seco. Tome baos de asiento segn le recomiende el mdico.  Evite productos que le irriten la piel o empeoren los sntomas, como jabones y geles de bao con fragancia.  Concurra a todas las visitas de seguimiento como se lo haya indicado el mdico. Esto es importante. Comunquese con un mdico si:  Los sntomas empeoran o no mejoran con los cuidados en el hogar.  Tiene escalofros o fiebre.  Tiene problemas para orinar.  No puede retraer el prepucio. Solicite ayuda de inmediato si:  Siente dolor intenso.  No puede orinar. Resumen  La balanitis es la inflamacin de la cabeza del pene (glande) causada por irritacin o infeccin. Esta afeccin es ms frecuente en los hombres no circuncidados.  La balanitis causa dolor, enrojecimiento e hinchazn del glande.  Es importante una buena higiene personal.  El tratamiento puede incluir mejorar la higiene personal y aplicarse cremas o ungentos.  Comunquese con un mdico si los sntomas empeoran o no mejoran con el cuidado en el hogar. Esta informacin no tiene como fin reemplazar el consejo del mdico. Asegrese de hacerle al mdico cualquier pregunta que tenga. Document Revised: 01/30/2019 Document Reviewed: 01/30/2019 Elsevier Patient Education  2021 Elsevier Inc.  

## 2020-02-22 LAB — CMP14+EGFR
ALT: 40 IU/L (ref 0–44)
AST: 33 IU/L (ref 0–40)
Albumin/Globulin Ratio: 1.6 (ref 1.2–2.2)
Albumin: 4.5 g/dL (ref 3.7–4.7)
Alkaline Phosphatase: 101 IU/L (ref 44–121)
BUN/Creatinine Ratio: 11 (ref 10–24)
BUN: 9 mg/dL (ref 8–27)
Bilirubin Total: 0.4 mg/dL (ref 0.0–1.2)
CO2: 24 mmol/L (ref 20–29)
Calcium: 9.5 mg/dL (ref 8.6–10.2)
Chloride: 104 mmol/L (ref 96–106)
Creatinine, Ser: 0.84 mg/dL (ref 0.76–1.27)
GFR calc Af Amer: 101 mL/min/{1.73_m2} (ref >59)
GFR calc non Af Amer: 87 mL/min/{1.73_m2} (ref >59)
Globulin, Total: 2.9 g/dL (ref 1.5–4.5)
Glucose: 119 mg/dL — ABNORMAL HIGH (ref 65–99)
Potassium: 4.7 mmol/L (ref 3.5–5.2)
Sodium: 140 mmol/L (ref 134–144)
Total Protein: 7.4 g/dL (ref 6.0–8.5)

## 2020-02-22 LAB — LIPID PANEL
Chol/HDL Ratio: 3.6 ratio (ref 0.0–5.0)
Cholesterol, Total: 127 mg/dL (ref 100–199)
HDL: 35 mg/dL — ABNORMAL LOW (ref >39)
LDL Chol Calc (NIH): 73 mg/dL (ref 0–99)
Triglycerides: 98 mg/dL (ref 0–149)
VLDL Cholesterol Cal: 19 mg/dL (ref 5–40)

## 2020-02-22 LAB — HEPATITIS C ANTIBODY: Hep C Virus Ab: <0.1 s/co ratio (ref 0.0–0.9)

## 2020-04-07 ENCOUNTER — Ambulatory Visit: Payer: Managed Care, Other (non HMO) | Admitting: Urology

## 2020-05-15 ENCOUNTER — Other Ambulatory Visit: Payer: Self-pay | Admitting: Family Medicine

## 2020-06-26 ENCOUNTER — Encounter: Payer: Self-pay | Admitting: Orthopedic Surgery

## 2020-06-26 ENCOUNTER — Other Ambulatory Visit: Payer: Self-pay

## 2020-06-26 ENCOUNTER — Ambulatory Visit (INDEPENDENT_AMBULATORY_CARE_PROVIDER_SITE_OTHER): Payer: BC Managed Care – PPO | Admitting: Orthopedic Surgery

## 2020-06-26 VITALS — BP 148/83 | HR 70 | Ht 65.0 in | Wt 190.0 lb

## 2020-06-26 DIAGNOSIS — M1711 Unilateral primary osteoarthritis, right knee: Secondary | ICD-10-CM

## 2020-06-26 DIAGNOSIS — M1712 Unilateral primary osteoarthritis, left knee: Secondary | ICD-10-CM

## 2020-06-26 MED ORDER — DICLOFENAC SODIUM 1 % EX GEL: 2.0000 g | Freq: Four times a day (QID) | CUTANEOUS | 1 refills | Status: DC

## 2020-06-26 NOTE — Patient Instructions (Signed)

## 2020-06-26 NOTE — Progress Notes (Signed)
Orthopaedic Clinic Return  Assessment: Tom [name] is a 75 y.o. male with the following: Right knee arthritis  Plan: Reviewed radiographs of his right knee from August, 2021.  He has moderate degenerative changes, primarily within the medial compartment.  This was discussed with the patient.  I recommended Voltaren gel to help with his symptoms.  I have also offered him a steroid injection.  He has elected to proceed.  We briefly discussed the left knee, which has more advanced arthritis.  I reiterated that a knee arthroscopy is not beneficial for him, as I believe would worsen his symptoms.  If he wishes to be considered for total knee arthroplasty, he will have to schedule appointment with Dr. Aline Brochure.  He stated he would like to see if the right knee pain improves before consideration for surgery on his left knee.  All questions were answered and he is amenable to this plan.  Follow-up as needed.  Procedure note injection Right knee joint   Verbal consent was obtained to inject the right knee joint  Timeout was completed to confirm the site of injection.  The skin was prepped with alcohol and ethyl chloride was sprayed at the injection site.  A 21-gauge needle was used to inject 40 mg of Depo-Medrol and 1% lidocaine (3 cc) into the right knee using an anterolateral approach.  There were no complications. A sterile bandage was applied.   Meds ordered this encounter  Medications  . diclofenac Sodium (VOLTAREN) 1 % GEL    Sig: Apply 2 g topically 4 (four) times daily.    Dispense:  150 g    Refill:  1    Body mass index is 31.62 kg/m.  Follow-up: Return if symptoms worsen or fail to improve.   Subjective:  Chief Complaint  Patient presents with  . Knee Pain    Rt knee pain for years but worse in past 1-2 weeks, had to do some climbing recently and thinks it may have caused the pain.     History of Present Illness: Tom [name] is a 75 y.o. male who presents to  clinic for evaluation of his right knee.  Briefly, I saw him in the fall, 2021 for his left knee.  He states he had pain in his right knee for a couple of years, but had an acute worsening approximately 1-2 weeks ago.  No specific injury.  Pain is primarily located over the medial aspect of his knee, with some radiating proximally.  He is taking over-the-counter medications as needed.  No injection in his right knee recently.  No physical therapy or appropriate exercises.  He has difficulty working due to the pain.  Review of Systems: No fevers or chills No numbness or tingling No chest pain No shortness of breath No bowel or bladder dysfunction No GI distress No headaches  Objective: BP (!) 148/83   Pulse 70   Ht 5\' 5"  (1.651 m)   Wt 190 lb (86.2 kg)   BMI 31.62 kg/m   Physical Exam:  Alert and oriented.  No acute distress. Mild right-sided antalgic gait  Evaluation of the right knee demonstrates no effusion.  No deformity is appreciated.  He has full range of motion from full extension, 220 degrees of flexion with minimal discomfort.  Negative Lachman.  No laxity is appreciated with valgus stress testing, although this does recreate some of his discomfort.  He is tender palpation along the medial joint line, as well as over the medial femoral condyle.  No bruising is appreciated.  IMAGING: I personally ordered and reviewed the following images:  X-rays from August, 2021 demonstrates varus alignment overall.  Moderate loss of joint space within the medial compartment.  Some small osteophytes are appreciated.  Mordecai Rasmussen, MD 06/26/2020 12:18 PM

## 2020-07-15 ENCOUNTER — Encounter: Payer: Self-pay | Admitting: Orthopedic Surgery

## 2020-07-15 ENCOUNTER — Ambulatory Visit: Payer: BC Managed Care – PPO | Admitting: Orthopedic Surgery

## 2020-07-15 ENCOUNTER — Other Ambulatory Visit: Payer: Self-pay

## 2020-07-15 ENCOUNTER — Ambulatory Visit: Payer: BC Managed Care – PPO

## 2020-07-15 VITALS — BP 141/78 | HR 64 | Ht 65.0 in | Wt 190.0 lb

## 2020-07-15 DIAGNOSIS — M1711 Unilateral primary osteoarthritis, right knee: Secondary | ICD-10-CM | POA: Diagnosis not present

## 2020-07-15 DIAGNOSIS — G8929 Other chronic pain: Secondary | ICD-10-CM

## 2020-07-15 DIAGNOSIS — M25561 Pain in right knee: Secondary | ICD-10-CM

## 2020-07-15 MED ORDER — PREDNISONE 10 MG (21) PO TBPK: ORAL_TABLET | ORAL | 0 refills | Status: DC

## 2020-07-15 NOTE — Progress Notes (Signed)
Orthopaedic Clinic Return  Assessment: Tom [name] is a 75 y.o. male with the following: Right knee arthritis  Plan: [name] returns  to clinic today for repeat evaluation of his right knee.  He had an injection 2-3 weeks ago, which provided some improvement in his symptoms for just a few days.  He requested that we repeat x-rays today, which demonstrates moderate degenerative changes with almost complete loss of joint space.  He is interested in alternatives to improving his pain.  This was discussed in great detail with the assistance of a Spanish interpreter.  We will plan to proceed with a prednisone Dosepak, home exercise program and consideration for a knee brace, which he states he will purchase at [facility].  I have written him out of work for the next 2 weeks in an attempt to improve his symptoms.  If he continues to have pain when he returns to work, he may have to consider a new job.  We also briefly discussed the possibility of proceeding with total knee arthroplasty, but he is aware that he will have to see Dr. Aline Brochure to discuss this in more detail.   Meds ordered this encounter  Medications   predniSONE (STERAPRED UNI-PAK 21 TAB) 10 MG (21) TBPK tablet    Sig: 10 mg DS 12 as directed    Dispense:  48 tablet    Refill:  0    Alternative accepted; prednisone dose pak taper      Follow-up: Return if symptoms worsen or fail to improve.   Subjective:  Chief Complaint  Patient presents with   Knee Pain    Pain came back 2 days after injection, has also tried rubs with minor relief.     History of Present Illness: Tom [name] is a 75 y.o. male who presents to clinic for repeat evaluation of his right knee.  He was seen in clinic a few weeks ago, at which time he had a steroid injection.  He states that improved his pain for 2 days.  Since then, he has had progressive worsening of the pain in his right knee.  He has difficulty at work, as he is standing all day.   He uses Voltaren gel, with minimal improvement in symptoms.  He takes ibuprofen sparingly, because he does not want to hurt his kidneys.  He is concerned about returning to work, and is hopeful that he can be excused for a couple of weeks.  Review of Systems: No fevers or chills No numbness or tingling No chest pain No shortness of breath No bowel or bladder dysfunction No GI distress No headaches  Objective: BP (!) 141/78   Pulse 64   Ht 5\' 5"  (1.651 m)   Wt 190 lb (86.2 kg)   BMI 31.62 kg/m   Physical Exam:  Alert and oriented.  No acute distress. Mild right-sided antalgic gait  Evaluation of the right knee demonstrates no effusion.  No deformity is appreciated.  Full range of motion of his right knee.  Easily gets to 120 degrees of flexion.  Negative Lachman.  No increased laxity varus or valgus stress.  Tenderness along the medial joint line.  IMAGING: I personally ordered and reviewed the following images:  X-rays of the right knee were obtained in clinic today demonstrates a mild varus alignment overall.  He has moderate, near complete loss of joint space within the medial compartment.  Small osteophytes are noted within the patellofemoral compartment.  Impression: Moderate right knee arthritis.  Tom [name]  Tom Gower, MD 07/15/2020 12:07 PM

## 2020-07-15 NOTE — Patient Instructions (Signed)
Note for work, return in 2 weeks.    Knee Exercises  Ask your health care provider which exercises are safe for you. Do exercises exactly as told by your health care provider and adjust them as directed. It is normal to feel mild stretching, pulling, tightness, or discomfort as you do these exercises. Stop right away if you feel sudden pain or your pain gets worse. Do not begin these exercises until told by your health care provider.  Stretching and range-of-motion exercises These exercises warm up your muscles and joints and improve the movement and flexibility of your knee. These exercises also help to relieve pain and swelling.  Knee extension, prone Lie on your abdomen (prone position) on a bed. Place your left / right knee just beyond the edge of the surface so your knee is not on the bed. You can put a towel under your left / right thigh just above your kneecap for comfort. Relax your leg muscles and allow gravity to straighten your knee (extension). You should feel a stretch behind your left / right knee. Hold this position for 10 seconds. Scoot up so your knee is supported between repetitions. Repeat 10 times. Complete this exercise 3-4 times per week.     Knee flexion, active Lie on your back with both legs straight. If this causes back discomfort, bend your left / right knee so your foot is flat on the floor. Slowly slide your left / right heel back toward your buttocks. Stop when you feel a gentle stretch in the front of your knee or thigh (flexion). Hold this position for 10 seconds. Slowly slide your left / right heel back to the starting position. Repeat 10 times. Complete this exercise 3-4 times per week.      Quadriceps stretch, prone Lie on your abdomen on a firm surface, such as a bed or padded floor. Bend your left / right knee and hold your ankle. If you cannot reach your ankle or pant leg, loop a belt around your foot and grab the belt instead. Gently pull your heel  toward your buttocks. Your knee should not slide out to the side. You should feel a stretch in the front of your thigh and knee (quadriceps). Hold this position for 10 seconds. Repeat 10 times. Complete this exercise 3-4 times per week.      Hamstring, supine Lie on your back (supine position). Loop a belt or towel over the ball of your left / right foot. The ball of your foot is on the walking surface, right under your toes. Straighten your left / right knee and slowly pull on the belt to raise your leg until you feel a gentle stretch behind your knee (hamstring). Do not let your knee bend while you do this. Keep your other leg flat on the floor. Hold this position for 10 seconds. Repeat 10 times. Complete this exercise 3-4 times per week.   Strengthening exercises These exercises build strength and endurance in your knee. Endurance is the ability to use your muscles for a long time, even after they get tired.  Quadriceps, isometric This exercise stretches the muscles in front of your thigh (quadriceps) without moving your knee joint (isometric). Lie on your back with your left / right leg extended and your other knee bent. Put a rolled towel or small pillow under your knee if told by your health care provider. Slowly tense the muscles in the front of your left / right thigh. You should see your  kneecap slide up toward your hip or see increased dimpling just above the knee. This motion will push the back of the knee toward the floor. For 10 seconds, hold the muscle as tight as you can without increasing your pain. Relax the muscles slowly and completely. Repeat 10 times. Complete this exercise 3-4 times per week. .     Straight leg raises This exercise stretches the muscles in front of your thigh (quadriceps) and the muscles that move your hips (hip flexors). Lie on your back with your left / right leg extended and your other knee bent. Tense the muscles in the front of your left /  right thigh. You should see your kneecap slide up or see increased dimpling just above the knee. Your thigh may even shake a bit. Keep these muscles tight as you raise your leg 4-6 inches (10-15 cm) off the floor. Do not let your knee bend. Hold this position for 10 seconds. Keep these muscles tense as you lower your leg. Relax your muscles slowly and completely after each repetition. Repeat 10 times. Complete this exercise 3-4 times per week.  Hamstring, isometric Lie on your back on a firm surface. Bend your left / right knee about 30 degrees. Dig your left / right heel into the surface as if you are trying to pull it toward your buttocks. Tighten the muscles in the back of your thighs (hamstring) to "dig" as hard as you can without increasing any pain. Hold this position for 10 seconds. Release the tension gradually and allow your muscles to relax completely for __________ seconds after each repetition. Repeat 10 times. Complete this exercise 3-4 times per week.  Hamstring curls If told by your health care provider, do this exercise while wearing ankle weights. Begin with 5 lb weights. Then increase the weight by 1 lb (0.5 kg) increments. You can also use an exercise band Lie on your abdomen with your legs straight. Bend your left / right knee as far as you can without feeling pain. Keep your hips flat against the floor. Hold this position for 10 seconds. Slowly lower your leg to the starting position. Repeat 10 times. Complete this exercise 3-4 times per week.      Squats This exercise strengthens the muscles in front of your thigh and knee (quadriceps). Stand in front of a table, with your feet and knees pointing straight ahead. You may rest your hands on the table for balance but not for support. Slowly bend your knees and lower your hips like you are going to sit in a chair. Keep your weight over your heels, not over your toes. Keep your lower legs upright so they are parallel  with the table legs. Do not let your hips go lower than your knees. Do not bend lower than told by your health care provider. If your knee pain increases, do not bend as low. Hold the squat position for 10 seconds. Slowly push with your legs to return to standing. Do not use your hands to pull yourself to standing. Repeat 10 times. Complete this exercise 3-4 times per week .     Wall slides This exercise strengthens the muscles in front of your thigh and knee (quadriceps). Lean your back against a smooth wall or door, and walk your feet out 18-24 inches (46-61 cm) from it. Place your feet hip-width apart. Slowly slide down the wall or door until your knees bend 90 degrees. Keep your knees over your heels, not over your  toes. Keep your knees in line with your hips. Hold this position for 10 seconds. Repeat 10 times. Complete this exercise 3-4 times per week.      Straight leg raises This exercise strengthens the muscles that rotate the leg at the hip and move it away from your body (hip abductors). Lie on your side with your left / right leg in the top position. Lie so your head, shoulder, knee, and hip line up. You may bend your bottom knee to help you keep your balance. Roll your hips slightly forward so your hips are stacked directly over each other and your left / right knee is facing forward. Leading with your heel, lift your top leg 4-6 inches (10-15 cm). You should feel the muscles in your outer hip lifting. Do not let your foot drift forward. Do not let your knee roll toward the ceiling. Hold this position for 10 seconds. Slowly return your leg to the starting position. Let your muscles relax completely after each repetition. Repeat 10 times. Complete this exercise 3-4 times per week.      Straight leg raises This exercise stretches the muscles that move your hips away from the front of the pelvis (hip extensors). Lie on your abdomen on a firm surface. You can put a pillow  under your hips if that is more comfortable. Tense the muscles in your buttocks and lift your left / right leg about 4-6 inches (10-15 cm). Keep your knee straight as you lift your leg. Hold this position for 10 seconds. Slowly lower your leg to the starting position. Let your leg relax completely after each repetition. Repeat 10 times. Complete this exercise 3-4 times per week.

## 2020-07-29 ENCOUNTER — Ambulatory Visit: Payer: BC Managed Care – PPO | Admitting: Orthopedic Surgery

## 2020-07-29 ENCOUNTER — Other Ambulatory Visit: Payer: Self-pay

## 2020-07-29 ENCOUNTER — Encounter: Payer: Self-pay | Admitting: Orthopedic Surgery

## 2020-07-29 ENCOUNTER — Ambulatory Visit (INDEPENDENT_AMBULATORY_CARE_PROVIDER_SITE_OTHER): Payer: BC Managed Care – PPO | Admitting: Orthopedic Surgery

## 2020-07-29 VITALS — BP 144/82 | HR 87 | Ht 65.0 in | Wt 190.0 lb

## 2020-07-29 DIAGNOSIS — M1711 Unilateral primary osteoarthritis, right knee: Secondary | ICD-10-CM | POA: Diagnosis not present

## 2020-07-29 NOTE — Patient Instructions (Signed)
Ok to return to work 08/03/20

## 2020-07-29 NOTE — Progress Notes (Signed)
Orthopaedic Clinic Return  Assessment: Tom [name] is a 75 y.o. male with the following: Right knee arthritis  Plan: Tom [name] returns to clinic today and states the right knee pain is improving.  He will continue taking diclofenac, as well as using Voltaren gel.  He will also continue using the compression sleeve.  Advised Tom [name] to continue working with the activities as this will maintain the strength in the right knee.  Okay for Tom [name] to return to work.  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  Follow-up as needed.  Follow-up: Return if symptoms worsen or fail to improve.   Subjective:  Chief Complaint  Patient presents with   Knee Pain    Rt knee pain getting a little better.     History of Present Illness: Tom [name] is a 75 y.o. male who presents to clinic for repeat evaluation of his right knee.  He was last seen in clinic approximately 2 weeks ago.  Since then, he has been using a compression sleeve on his knee.  He is also been using Voltaren gel, completing home exercises.  He states his pain is significantly improved.  He feels well enough to return to work.  Review of Systems: No fevers or chills No numbness or tingling No chest pain No shortness of breath No bowel or bladder dysfunction No GI distress No headaches  Objective: BP (!) 144/82   Pulse 87   Ht 5\' 5"  (1.651 m)   Wt 190 lb (86.2 kg)   BMI 31.62 kg/m   Physical Exam:  Alert and oriented.  No acute distress. Mild right-sided antalgic gait  Evaluation of the right knee demonstrates no effusion.  No deformity is appreciated.  Full range of motion of his right knee.  Easily gets to 120 degrees of flexion.  Negative Lachman.  No increased laxity varus or valgus stress.  Tenderness along the medial joint line.  IMAGING: I personally ordered and reviewed the following images:  No new imaging obtained today.  Mordecai Rasmussen, MD 07/29/2020 12:12 PM

## 2020-08-20 ENCOUNTER — Encounter: Payer: Self-pay | Admitting: Family Medicine

## 2020-08-20 ENCOUNTER — Ambulatory Visit: Payer: BC Managed Care – PPO | Admitting: Family Medicine

## 2020-08-20 ENCOUNTER — Other Ambulatory Visit: Payer: Self-pay

## 2020-08-20 VITALS — BP 113/63 | HR 72 | Temp 98.0°F | Resp 20 | Ht 65.0 in | Wt 190.0 lb

## 2020-08-20 DIAGNOSIS — M545 Low back pain, unspecified: Secondary | ICD-10-CM

## 2020-08-20 DIAGNOSIS — G8929 Other chronic pain: Secondary | ICD-10-CM

## 2020-08-20 DIAGNOSIS — M25562 Pain in left knee: Secondary | ICD-10-CM

## 2020-08-20 DIAGNOSIS — E1129 Type 2 diabetes mellitus with other diabetic kidney complication: Secondary | ICD-10-CM

## 2020-08-20 DIAGNOSIS — M25561 Pain in right knee: Secondary | ICD-10-CM | POA: Diagnosis not present

## 2020-08-20 DIAGNOSIS — Z1211 Encounter for screening for malignant neoplasm of colon: Secondary | ICD-10-CM

## 2020-08-20 DIAGNOSIS — E1169 Type 2 diabetes mellitus with other specified complication: Secondary | ICD-10-CM | POA: Diagnosis not present

## 2020-08-20 DIAGNOSIS — R809 Proteinuria, unspecified: Secondary | ICD-10-CM | POA: Diagnosis not present

## 2020-08-20 DIAGNOSIS — E785 Hyperlipidemia, unspecified: Secondary | ICD-10-CM

## 2020-08-20 LAB — BAYER DCA HB A1C WAIVED: HB A1C (BAYER DCA - WAIVED): 7.1 % — ABNORMAL HIGH (ref <7.0)

## 2020-08-20 MED ORDER — DICLOFENAC SODIUM 1 % EX GEL: 2.0000 g | Freq: Four times a day (QID) | CUTANEOUS | 99 refills | Status: DC

## 2020-08-20 MED ORDER — TIZANIDINE HCL 2 MG PO TABS: 2.0000 mg | ORAL_TABLET | Freq: Every evening | ORAL | 0 refills | Status: DC | PRN

## 2020-08-20 NOTE — Progress Notes (Signed)
Subjective: CC: DM PCP: Janora Norlander, DO [id number] Tom [name] is a 75 y.o. male presenting to clinic today for:  Stratus video interpreter [place], [place] used for Spanish translation of this visit  1. Type 2 Diabetes with hyperlipidemia:  Patient reports compliance with his metformin 500 mg twice daily, Lipitor 20 mg daily.    Last eye exam: Had eye exam performed 6 months ago with Dr. Marin Comment Last foot exam: Up-to-date Last A1c:  Lab Results  Component Value Date   HGBA1C 7.0 (H) 02/21/2020   Nephropathy screen indicated?:  Up-to-date Last flu, zoster and/or pneumovax:  Immunization History  Administered Date(s) Administered   Hepatitis B 06/18/2017   Hepatitis B, adult 07/21/2017   Influenza, High Dose Seasonal PF 01/02/2018   Influenza,inj,Quad PF,6+ Mos 11/06/2014, 11/03/2015, 11/23/2016   Influenza-Unspecified 12/31/2013   Moderna Sars-Covid-2 Vaccination 03/28/2019, 04/26/2019   Pneumococcal Conjugate-13 06/18/2017   Pneumococcal Polysaccharide-23 08/17/2018   Td 09/05/1994, 11/13/2012   Tdap 11/13/2012    ROS: No chest pain, shortness of breath, change in urine output or reports of excessive thirst.  No sensory changes in the feet nor any ulcerations of the feet  2.  Chronic knee and back pain Patient reports chronic knee and back pain.  He is followed by orthopedist for this and was recently given topical Voltaren gel which he does feel is helpful.  He would like a renewal on this.  He had such excessive back pain and knee pain recently that he had difficulty ambulating.  That has since resolved.  He continues to have intermittent low back pain however that does not radiate.  Certain positions will aggravate his back pain, ambulation also seems to aggravate.  Has used some OTC analgesics with various degrees of improvement.    ROS: Per HPI  No Known Allergies Past Medical History:  Diagnosis Date   Diabetes mellitus without complication (HCC)     Hemorrhoids     Current Outpatient Medications:    atorvastatin (LIPITOR) 20 MG tablet, Take 1 tablet by mouth once daily, Disp: 90 tablet, Rfl: 0   clotrimazole-betamethasone (LOTRISONE) cream, Apply 1 application topically 2 (two) times daily. x10-14 days (SIG IN [place]) (Patient not taking: No sig reported), Disp: 30 g, Rfl: 0   diclofenac Sodium (VOLTAREN) 1 % GEL, Apply 4 g topically 4 (four) times daily. For knee pain  (SIG in spanish please) (Patient not taking: No sig reported), Disp: 400 g, Rfl: 1   diclofenac Sodium (VOLTAREN) 1 % GEL, Apply 2 g topically 4 (four) times daily., Disp: 150 g, Rfl: 1   Diclofenac-miSOPROStol 50-0.2 MG TBEC, Take 1 tablet by mouth 2 (two) times daily. (Patient not taking: No sig reported), Disp: 60 tablet, Rfl: 0   glucosamine-chondroitin 500-400 MG tablet, Take 1 tablet by mouth 3 (three) times daily., Disp: , Rfl:    metFORMIN (GLUCOPHAGE) 500 MG tablet, Take 1 tablet (500 mg total) by mouth 2 (two) times daily with a meal., Disp: 180 tablet, Rfl: 1   predniSONE (STERAPRED UNI-PAK 21 TAB) 10 MG (21) TBPK tablet, 10 mg DS 12 as directed (Patient not taking: Reported on 07/29/2020), Disp: 48 tablet, Rfl: 0 Social History   Socioeconomic History   Marital status: Single    Spouse name: Not on file   Number of children: Not on file   Years of education: Not on file   Highest education level: Not on file  Occupational History   Not on file  Tobacco Use  Smoking status: Former   Smokeless tobacco: Never  Scientific laboratory technician Use: Never used  Substance and Sexual Activity   Alcohol use: No   Drug use: No   Sexual activity: Not on file  Other Topics Concern   Not on file  Social History Narrative   Not on file   Social Determinants of Health   Financial Resource Strain: Not on file  Food Insecurity: Not on file  Transportation Needs: Not on file  Physical Activity: Not on file  Stress: Not on file  Social Connections: Not on file  Intimate  Partner Violence: Not on file   Family History  Problem Relation Age of Onset   Cancer Mother    Heart Problems Father    Stomach cancer Brother     Objective: Office vital signs reviewed. BP 113/63   Pulse 72   Temp 98 F (36.7 C)   Resp 20   Ht 5\' 5"  (1.651 m)   Wt 190 lb (86.2 kg)   SpO2 96%   BMI 31.62 kg/m   Physical Examination:  General: Awake, alert, well nourished, No acute distress Cardio: regular rate and rhythm, S1S2 heard, no murmurs appreciated Pulm: clear to auscultation bilaterally, no wheezes, rhonchi or rales; normal work of breathing on room air Extremities: warm, well perfused, No edema, cyanosis or clubbing; +2 pulses bilaterally MSK: normal gait and station  Assessment/ Plan: 75 y.o. male   Type 2 diabetes mellitus with microalbuminuria, without long-term current use of insulin (HCC) - Plan: Microalbumin / creatinine urine ratio, Bayer DCA Hb A1c Waived  Hyperlipidemia associated with type 2 diabetes mellitus (HCC)  Chronic bilateral low back pain without sciatica - Plan: tiZANidine (ZANAFLEX) 2 MG tablet  Chronic pain of both knees - Plan: diclofenac Sodium (VOLTAREN) 1 % GEL  Colon cancer screening - Plan: Ambulatory referral to Gastroenterology  Sugar is technically uncontrolled now with A1c rising 7.1.  Reinforced diet modification.  If persistently elevated by her next visit we will plan for increased dose of metformin.  Check urine microalbumin  Not yet due for fasting lipid panel.  Continue statin  Back pain seems degenerative in nature.  However, low threshold to perform imaging if symptoms do not resolve and/or if they worsen or accompanied by any other red flag signs or symptoms.  Tizanidine prescribed for as needed use.  Caution sedation.  Okay to continue OTC analgesics as needed  Voltaren gel renewed for chronic pain relief of the knees  Referral back to gastroenterology placed.  He is overdue for 5-year follow-up on colon cancer  screening.  No orders of the defined types were placed in this encounter.  No orders of the defined types were placed in this encounter.    Janora Norlander, DO [place] [phone]

## 2020-08-20 NOTE — Patient Instructions (Signed)
Precaucin con Tizanidina (relajante muscular). Causa somnolencia, as que tmelo solo a la hora de acostarse NICAMENTE si es necesario para el dolor de espalda que no se alivia con Tylenol.

## 2020-08-21 LAB — MICROALBUMIN / CREATININE URINE RATIO
Creatinine, Urine: 155.8 mg/dL
Microalb/Creat Ratio: 32 mg/g creat — ABNORMAL HIGH (ref 0–29)
Microalbumin, Urine: 49.9 ug/mL

## 2020-09-29 ENCOUNTER — Other Ambulatory Visit: Payer: Self-pay | Admitting: Family Medicine

## 2020-12-30 ENCOUNTER — Other Ambulatory Visit: Payer: Self-pay | Admitting: Family Medicine

## 2021-02-12 ENCOUNTER — Encounter: Payer: Self-pay | Admitting: Family Medicine

## 2021-02-19 ENCOUNTER — Encounter: Payer: Self-pay | Admitting: Family Medicine

## 2021-02-19 ENCOUNTER — Ambulatory Visit (INDEPENDENT_AMBULATORY_CARE_PROVIDER_SITE_OTHER): Payer: BC Managed Care – PPO | Admitting: Family Medicine

## 2021-02-19 VITALS — BP 126/77 | HR 65 | Temp 98.0°F | Ht 65.0 in | Wt 190.0 lb

## 2021-02-19 DIAGNOSIS — E1129 Type 2 diabetes mellitus with other diabetic kidney complication: Secondary | ICD-10-CM

## 2021-02-19 DIAGNOSIS — G8929 Other chronic pain: Secondary | ICD-10-CM

## 2021-02-19 DIAGNOSIS — R809 Proteinuria, unspecified: Secondary | ICD-10-CM | POA: Diagnosis not present

## 2021-02-19 DIAGNOSIS — M25562 Pain in left knee: Secondary | ICD-10-CM

## 2021-02-19 DIAGNOSIS — Z0001 Encounter for general adult medical examination with abnormal findings: Secondary | ICD-10-CM

## 2021-02-19 DIAGNOSIS — E785 Hyperlipidemia, unspecified: Secondary | ICD-10-CM

## 2021-02-19 DIAGNOSIS — M25561 Pain in right knee: Secondary | ICD-10-CM

## 2021-02-19 DIAGNOSIS — Z789 Other specified health status: Secondary | ICD-10-CM

## 2021-02-19 DIAGNOSIS — Z0000 Encounter for general adult medical examination without abnormal findings: Secondary | ICD-10-CM

## 2021-02-19 DIAGNOSIS — R0789 Other chest pain: Secondary | ICD-10-CM

## 2021-02-19 DIAGNOSIS — Z1211 Encounter for screening for malignant neoplasm of colon: Secondary | ICD-10-CM

## 2021-02-19 DIAGNOSIS — R21 Rash and other nonspecific skin eruption: Secondary | ICD-10-CM

## 2021-02-19 DIAGNOSIS — Z125 Encounter for screening for malignant neoplasm of prostate: Secondary | ICD-10-CM

## 2021-02-19 DIAGNOSIS — E1169 Type 2 diabetes mellitus with other specified complication: Secondary | ICD-10-CM | POA: Diagnosis not present

## 2021-02-19 LAB — BAYER DCA HB A1C WAIVED: HB A1C (BAYER DCA - WAIVED): 7.3 % — ABNORMAL HIGH (ref 4.8–5.6)

## 2021-02-19 MED ORDER — DICLOFENAC SODIUM 1 % EX GEL: 2.0000 g | Freq: Four times a day (QID) | CUTANEOUS | 99 refills | Status: DC

## 2021-02-19 MED ORDER — ATORVASTATIN CALCIUM 20 MG PO TABS: 20.0000 mg | ORAL_TABLET | Freq: Every day | ORAL | 3 refills | Status: DC

## 2021-02-19 MED ORDER — METFORMIN HCL 500 MG PO TABS: 500.0000 mg | ORAL_TABLET | Freq: Two times a day (BID) | ORAL | 3 refills | Status: DC

## 2021-02-19 NOTE — Patient Instructions (Signed)
You had labs performed today.  You will be contacted with the results of the labs once they are available, usually in the next 3 business days for routine lab work.  If you have an active my chart account, they will be released to your MyChart.  If you prefer to have these labs released to you via telephone, please let us know.  If you had a pap smear or biopsy performed, expect to be contacted in about 7-10 days.  Cuidados preventivos en los hombres a Proofreader de los 59 aos de edad Preventive Care 23 Years and Older, Male Los cuidados preventivos hacen referencia a las opciones en cuanto al estilo de vida y a las visitas al mdico, las cuales pueden promover la salud y Musician. Las visitas de cuidado preventivo tambin se denominan exmenes de Therapist, sports. Qu puedo esperar para mi visita de cuidado preventivo? Asesoramiento Durante la visita de cuidado preventivo, el mdico puede preguntarle sobre lo siguiente: Antecedentes mdicos, incluidos los siguientes: Problemas mdicos pasados. Antecedentes mdicos familiares. Sus antecedentes de cadas. Salud actual, incluido lo siguiente: Su bienestar emocional. Restaurant manager, fast food y las relaciones personales. Su actividad sexual. Memoria y capacidad de comprensin (capacidad intelectual). Estilo de vida, incluido lo siguiente: Consumo de alcohol, nicotina, tabaco o drogas. Acceso a armas de fuego. Hbitos de alimentacin, ejercicio y sueo. Su trabajo y [place] laboral. Uso de [place] solar. Cuestiones de seguridad, como el uso de cinturn de seguridad y casco de Secondary school teacher. Examen fsico El mdico revisar lo siguiente: IT consultant y [place]. Estos pueden usarse para calcular el IMC (ndice de masa corporal). [facility] es una medicin que indica si tiene un peso saludable. Circunferencia de la cintura. Es Ardelia Mems medicin alrededor de Science writer. Esta medicin tambin indica si tiene un peso saludable y puede ayudar a predecir su riesgo de padecer  ciertas enfermedades, como diabetes tipo 2 y presin arterial alta. Frecuencia cardaca y presin arterial. Temperatura corporal. Piel para detectar manchas anormales. Qu vacunas necesito? Las vacunas se aplican a varias edades, segn un cronograma. El [employer] recomendar vacunas segn su edad, sus antecedentes mdicos, su estilo de vida y otros factores, como los viajes o el lugar donde trabaja. Qu pruebas necesito? Pruebas de deteccin El mdico puede recomendar pruebas de deteccin de ciertas afecciones. Esto puede incluir: Niveles de lpidos y colesterol. Pruebas de deteccin de la diabetes. Esto se Set designer un control del azcar en la sangre (glucosa) despus de no haber comido durante un periodo de tiempo (ayuno). Prueba de hepatitis C. Prueba de hepatitis B. Prueba del VIH (virus de inmunodeficiencia humana). Pruebas de infecciones de transmisin sexual (ITS), si est en riesgo. Pruebas de deteccin de cncer de pulmn. Pruebas de deteccin de Surveyor, minerals. Examen de deteccin del cncer de prstata. Estudio de deteccin de aneurisma artico abdominal (AAA). Es posible que necesite este estudio si es fumador o lo fue en el pasado. Hable con su mdico [employer], las opciones de tratamiento y, si corresponde, la necesidad de Optometrist ms pruebas. Siga estas instrucciones en su casa: Comida y bebida  Siga una dieta que incluya frutas y verduras frescas, cereales integrales, protenas magras y productos lcteos descremados. Limite el consumo de alimentos con alto contenido de Location manager, grasas saturadas y sal. Tome los suplementos vitamnicos y minerales como se lo haya indicado el mdico. No beba alcohol si el mdico se lo prohbe. Si bebe alcohol: Limite la cantidad que consume de 0 a 2 medidas por  da. Sepa cunta cantidad de alcohol hay en las bebidas que toma. En los [place], una medida equivale a una botella de cerveza de 12 oz  (355 ml), un vaso de vino de 5 oz (148 ml) o un vaso de una bebida alcohlica de alta graduacin de 1 oz (44 ml). Estilo de [employer] dientes a la maana y a la noche con Social research officer, government con fluoruro. Use hilo dental una vez al da. Haga al menos 30 minutos de ejercicio, 5 o ms das cada semana. No consuma ningn producto que contenga nicotina o tabaco. Estos productos incluyen cigarrillos, tabaco para Higher education careers adviser y aparatos de vapeo, como los Psychologist, sport and exercise. Si necesita ayuda para dejar de fumar, consulte al mdico. No consuma drogas. Si es sexualmente activo, practique sexo seguro. Use un condn u otra forma de proteccin para prevenir las infecciones de transmisin sexual (ITS). Tome aspirina nicamente como se lo haya indicado el mdico. Asegrese de que comprende qu cantidad y cul presentacin debe tomar. Trabaje con el mdico para averiguar si es seguro y beneficioso para usted tomar aspirina a diario. Pregntele al mdico si necesita tomar un medicamento para reducir Freight forwarder (estatina). Busque maneras saludables de Scientific laboratory technician, tales como: Meditacin, yoga o Conservation officer, nature. Lleve un diario personal. Hable con una persona confiable. Pase tiempo con amigos y familiares. Seguridad [place] siempre el cinturn de seguridad al conducir o viajar en un vehculo. No conduzca: Si ha estado bebiendo alcohol. No viaje con un conductor que ha estado bebiendo. Si est cansado o distrado. Mientras est enviando mensajes de texto. Si ha estado usando sustancias o drogas que alteran la funcin mental. Use un casco y otros equipos de proteccin durante las actividades deportivas. Si tiene armas de fuego en su casa, asegrese de seguir todos los procedimientos de seguridad correspondientes. Minimice la exposicin a la radiacin UV para reducir el riesgo de cncer de piel. Cundo volver? Visite al mdico una vez al ao para una visita anual de control de bienestar. Pregntele  al mdico con qu frecuencia debe realizarse un control de la vista y los dientes. Mantenga su esquema de vacunacin al da. Esta informacin no tiene Marine scientist el consejo del mdico. Asegrese de hacerle al mdico cualquier pregunta que tenga. Document Revised: 08/05/2020 Document Reviewed: 08/05/2020 Elsevier Patient Education  [place].
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
## 2021-02-20 LAB — CMP14+EGFR
ALT: 25 IU/L (ref 0–44)
AST: 18 IU/L (ref 0–40)
Albumin/Globulin Ratio: 1.7 (ref 1.2–2.2)
Albumin: 4.5 g/dL (ref 3.7–4.7)
Alkaline Phosphatase: 98 IU/L (ref 44–121)
BUN/Creatinine Ratio: 11 (ref 10–24)
BUN: 11 mg/dL (ref 8–27)
Bilirubin Total: 0.4 mg/dL (ref 0.0–1.2)
CO2: 22 mmol/L (ref 20–29)
Calcium: 9.2 mg/dL (ref 8.6–10.2)
Chloride: 104 mmol/L (ref 96–106)
Creatinine, Ser: 1 mg/dL (ref 0.76–1.27)
Globulin, Total: 2.6 g/dL (ref 1.5–4.5)
Glucose: 138 mg/dL — ABNORMAL HIGH (ref 70–99)
Potassium: 4.5 mmol/L (ref 3.5–5.2)
Sodium: 138 mmol/L (ref 134–144)
Total Protein: 7.1 g/dL (ref 6.0–8.5)
eGFR: 79 mL/min/{1.73_m2} (ref >59)

## 2021-02-20 LAB — MICROALBUMIN / CREATININE URINE RATIO
Creatinine, Urine: 187.3 mg/dL
Microalb/Creat Ratio: 103 mg/g creat — ABNORMAL HIGH (ref 0–29)
Microalbumin, Urine: 193.8 ug/mL

## 2021-02-20 LAB — LIPID PANEL
Chol/HDL Ratio: 3.5 ratio (ref 0.0–5.0)
Cholesterol, Total: 124 mg/dL (ref 100–199)
HDL: 35 mg/dL — ABNORMAL LOW (ref >39)
LDL Chol Calc (NIH): 65 mg/dL (ref 0–99)
Triglycerides: 133 mg/dL (ref 0–149)
VLDL Cholesterol Cal: 24 mg/dL (ref 5–40)

## 2021-02-20 LAB — PSA: Prostate Specific Ag, Serum: 1.2 ng/mL (ref 0.0–4.0)

## 2021-02-22 ENCOUNTER — Other Ambulatory Visit: Payer: BC Managed Care – PPO

## 2021-02-22 DIAGNOSIS — Z1211 Encounter for screening for malignant neoplasm of colon: Secondary | ICD-10-CM | POA: Diagnosis not present

## 2021-02-23 LAB — FECAL OCCULT BLOOD, IMMUNOCHEMICAL: Fecal Occult Bld: NEGATIVE

## 2021-03-11 DIAGNOSIS — L308 Other specified dermatitis: Secondary | ICD-10-CM | POA: Diagnosis not present

## 2021-03-11 DIAGNOSIS — L218 Other seborrheic dermatitis: Secondary | ICD-10-CM | POA: Diagnosis not present

## 2021-03-17 ENCOUNTER — Encounter: Payer: Self-pay | Admitting: Family Medicine

## 2021-03-17 ENCOUNTER — Ambulatory Visit: Payer: BC Managed Care – PPO | Admitting: Family Medicine

## 2021-03-17 VITALS — BP 129/79 | HR 74 | Temp 98.4°F | Ht 65.0 in | Wt 195.0 lb

## 2021-03-17 DIAGNOSIS — M791 Myalgia, unspecified site: Secondary | ICD-10-CM | POA: Diagnosis not present

## 2021-03-17 MED ORDER — METHYLPREDNISOLONE ACETATE 40 MG/ML IJ SUSP
80.0000 mg | Freq: Once | INTRAMUSCULAR | Status: AC
  Administered 2021-03-17: 80 mg via INTRAMUSCULAR

## 2021-03-17 MED ORDER — DICLOFENAC SODIUM 75 MG PO TBEC: 75.0000 mg | DELAYED_RELEASE_TABLET | Freq: Two times a day (BID) | ORAL | 0 refills | Status: DC

## 2021-03-17 MED ORDER — METHOCARBAMOL 500 MG PO TABS: 500.0000 mg | ORAL_TABLET | Freq: Three times a day (TID) | ORAL | 0 refills | Status: DC | PRN

## 2021-03-17 MED ORDER — METHYLPREDNISOLONE ACETATE 80 MG/ML IJ SUSP: 80.0000 mg | Freq: Once | INTRAMUSCULAR | Status: DC

## 2021-03-17 NOTE — Addendum Note (Signed)
Addended by: Zannie Cove on: 03/17/2021 03:25 PM   Modules accepted: Orders

## 2021-03-17 NOTE — Progress Notes (Signed)
Assessment & Plan:  1. Muscle pain Education provided on muscle strain. - methocarbamol (ROBAXIN) 500 MG tablet; Take 1 tablet (500 mg total) by mouth every 8 (eight) hours as needed for muscle spasms.  Dispense: 30 tablet; Refill: 0 - diclofenac (VOLTAREN) 75 MG EC tablet; Take 1 tablet (75 mg total) by mouth 2 (two) times daily.  Dispense: 30 tablet; Refill: 0 - methylPREDNISolone acetate (DEPO-MEDROL) injection 80 mg   Follow up plan: Return if symptoms worsen or fail to improve.  Hendricks Limes, MSN, APRN, FNP-C [facility]  Subjective:   Patient ID: Tom [name], male    DOB: [birth year], 76 y.o.   MRN: [id number]  HPI: Tom [name] is a 76 y.o. male presenting on 03/17/2021 for Shoulder Pain (Pt c/o o shoulder and neck pain for a few days now. Thinks it was the way he was sitting while watching tv, but now it is swollen)  Shoulder Pain: Patient complaints of right shoulder pain that comes from his neck. The pain is described as stabbing and constant .  The onset of the pain was  a few days ago .  No known injury or unusual activity. Symptoms are aggravated by all activities. Symptoms are diminished by medication: Voltaren gel.   Limited activities include: all activities.    ROS: Negative unless specifically indicated above in HPI.   Relevant past medical history reviewed and updated as indicated.   Allergies and medications reviewed and updated.   Current Outpatient Medications:    atorvastatin (LIPITOR) 20 MG tablet, Take 1 tablet (20 mg total) by mouth daily., Disp: 90 tablet, Rfl: 3   diclofenac Sodium (VOLTAREN) 1 % GEL, Apply 2 g topically 4 (four) times daily., Disp: 150 g, Rfl: PRN   glucosamine-chondroitin 500-400 MG tablet, Take 1 tablet by mouth 3 (three) times daily., Disp: , Rfl:    metFORMIN (GLUCOPHAGE) 500 MG tablet, Take 1 tablet (500 mg total) by mouth 2 (two) times daily with a meal., Disp: 180 tablet, Rfl: 3   omega-3 acid ethyl  esters (LOVAZA) 1 g capsule, Take by mouth 2 (two) times daily., Disp: , Rfl:    tiZANidine (ZANAFLEX) 2 MG tablet, Take 1 tablet (2 mg total) by mouth at bedtime as needed for muscle spasms., Disp: 30 tablet, Rfl: 0  Not on File  Objective:   BP 129/79    Pulse 74    Temp 98.4 F (36.9 C) (Temporal)    Ht 5\' 5"  (1.651 m)    Wt 195 lb (88.5 kg)    SpO2 93%    BMI 32.45 kg/m    Physical Exam Vitals reviewed.  Constitutional:      General: He is not in acute distress.    Appearance: Normal appearance. He is not ill-appearing, toxic-appearing or diaphoretic.  HENT:     Head: Normocephalic and atraumatic.  Eyes:     General: No scleral icterus.       Right eye: No discharge.        Left eye: No discharge.     Conjunctiva/sclera: Conjunctivae normal.  Cardiovascular:     Rate and Rhythm: Normal rate.  Pulmonary:     Effort: Pulmonary effort is normal. No respiratory distress.  Musculoskeletal:        General: Normal range of motion.     Right shoulder: Tenderness (trapezius muscle) present. No swelling, deformity, effusion, laceration or bony tenderness. Normal range of motion.     Cervical back: Normal range of motion.  Muscular tenderness present.  Skin:    General: Skin is warm and dry.  Neurological:     Mental Status: He is alert and oriented to person, place, and time. Mental status is at baseline.  Psychiatric:        Mood and Affect: Mood normal.        Behavior: Behavior normal.        Thought Content: Thought content normal.        Judgment: Judgment normal.

## 2021-03-29 ENCOUNTER — Other Ambulatory Visit: Payer: Self-pay | Admitting: Family Medicine

## 2021-03-29 DIAGNOSIS — M791 Myalgia, unspecified site: Secondary | ICD-10-CM

## 2021-03-30 NOTE — Telephone Encounter (Signed)
If he is still having significant pain, he needs to see a specialist/ go to PT.  This medication is not intended for long term use due to addiction potential.

## 2021-04-01 NOTE — Telephone Encounter (Signed)
PT AWARE OF RECOMMENDATIONS AND AWARE HE SHOULD NOT BE TAKING AS A LONG TERM MEDICATION. LET PT KNOW BY INTERPRETTOR ?

## 2021-04-02 ENCOUNTER — Other Ambulatory Visit: Payer: Self-pay

## 2021-04-02 DIAGNOSIS — M791 Myalgia, unspecified site: Secondary | ICD-10-CM

## 2021-04-02 NOTE — Telephone Encounter (Signed)
Please call for appointment ?

## 2021-04-02 NOTE — Telephone Encounter (Signed)
Last office visit 03/17/21 with you ?Last refill 03/17/21, #30, no refills ?

## 2021-04-05 NOTE — Telephone Encounter (Signed)
LM with person that handles messages for him, will make pt aware he needs appt ?

## 2021-05-04 DIAGNOSIS — H35363 Drusen (degenerative) of macula, bilateral: Secondary | ICD-10-CM | POA: Diagnosis not present

## 2021-05-04 DIAGNOSIS — H20011 Primary iridocyclitis, right eye: Secondary | ICD-10-CM | POA: Diagnosis not present

## 2021-05-11 DIAGNOSIS — H35363 Drusen (degenerative) of macula, bilateral: Secondary | ICD-10-CM | POA: Diagnosis not present

## 2021-05-16 ENCOUNTER — Encounter: Payer: Self-pay | Admitting: Cardiology

## 2021-05-16 DIAGNOSIS — R072 Precordial pain: Secondary | ICD-10-CM | POA: Insufficient documentation

## 2021-05-16 DIAGNOSIS — E118 Type 2 diabetes mellitus with unspecified complications: Secondary | ICD-10-CM | POA: Insufficient documentation

## 2021-05-16 DIAGNOSIS — E1169 Type 2 diabetes mellitus with other specified complication: Secondary | ICD-10-CM | POA: Insufficient documentation

## 2021-05-16 DIAGNOSIS — E785 Hyperlipidemia, unspecified: Secondary | ICD-10-CM | POA: Insufficient documentation
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
## 2021-05-19 ENCOUNTER — Ambulatory Visit: Payer: BC Managed Care – PPO | Admitting: Cardiology

## 2021-05-19 ENCOUNTER — Encounter: Payer: Self-pay | Admitting: Cardiology

## 2021-05-19 ENCOUNTER — Encounter: Payer: Self-pay | Admitting: *Deleted

## 2021-05-19 VITALS — BP 138/82 | HR 62 | Ht 63.0 in | Wt 187.0 lb

## 2021-05-19 DIAGNOSIS — E118 Type 2 diabetes mellitus with unspecified complications: Secondary | ICD-10-CM | POA: Diagnosis not present

## 2021-05-19 DIAGNOSIS — R072 Precordial pain: Secondary | ICD-10-CM

## 2021-05-19 DIAGNOSIS — E785 Hyperlipidemia, unspecified: Secondary | ICD-10-CM

## 2021-05-19 NOTE — Patient Instructions (Signed)
Medication Instructions:  ?The current medical regimen is effective;  continue present plan and medications. ? ?*If you need a refill on your cardiac medications before your next appointment, please call your pharmacy* ? ?Follow-Up: ?At [facility], you and your health needs are our priority.  As part of our continuing mission to provide you with exceptional heart care, we have created designated Provider Care Teams.  These Care Teams include your primary Cardiologist (physician) and Advanced Practice Providers (APPs -  Physician Assistants and Nurse Practitioners) who all work together to provide you with the care you need, when you need it. ? ?We recommend signing up for the patient portal called "MyChart".  Sign up information is provided on this After Visit Summary.  MyChart is used to connect with patients for Virtual Visits (Telemedicine).  Patients are able to view lab/test results, encounter notes, upcoming appointments, etc.  Non-urgent messages can be sent to your provider as well.   ?To learn more about what you can do with MyChart, go to [email or web address].   ? ?Your next appointment:   ?Follow up at needed. ? ? ?Important Information About Sugar ? ? ? ? ?  ?

## 2021-06-22 DIAGNOSIS — H10503 Unspecified blepharoconjunctivitis, bilateral: Secondary | ICD-10-CM | POA: Diagnosis not present

## 2021-07-02 ENCOUNTER — Encounter: Payer: Self-pay | Admitting: Family Medicine

## 2021-07-02 ENCOUNTER — Ambulatory Visit: Payer: BC Managed Care – PPO | Admitting: Family Medicine

## 2021-07-02 VITALS — BP 115/71 | HR 64 | Temp 97.7°F | Ht 63.0 in | Wt 184.8 lb

## 2021-07-02 DIAGNOSIS — E1169 Type 2 diabetes mellitus with other specified complication: Secondary | ICD-10-CM

## 2021-07-02 DIAGNOSIS — Z789 Other specified health status: Secondary | ICD-10-CM

## 2021-07-02 DIAGNOSIS — E1129 Type 2 diabetes mellitus with other diabetic kidney complication: Secondary | ICD-10-CM

## 2021-07-02 DIAGNOSIS — M25561 Pain in right knee: Secondary | ICD-10-CM | POA: Diagnosis not present

## 2021-07-02 DIAGNOSIS — M25562 Pain in left knee: Secondary | ICD-10-CM

## 2021-07-02 DIAGNOSIS — G8929 Other chronic pain: Secondary | ICD-10-CM

## 2021-07-02 DIAGNOSIS — R809 Proteinuria, unspecified: Secondary | ICD-10-CM

## 2021-07-02 DIAGNOSIS — E785 Hyperlipidemia, unspecified: Secondary | ICD-10-CM

## 2021-07-02 LAB — BAYER DCA HB A1C WAIVED: HB A1C (BAYER DCA - WAIVED): 6.6 % — ABNORMAL HIGH (ref 4.8–5.6)

## 2021-07-02 MED ORDER — METFORMIN HCL ER 500 MG PO TB24: 500.0000 mg | ORAL_TABLET | Freq: Every day | ORAL | 3 refills | Status: DC

## 2021-07-02 NOTE — Progress Notes (Signed)
Subjective: CC:Dm PCP: Janora Norlander, DO ZOX:[id number] Trompeter is a 76 y.o. male presenting to clinic today for:  [place] video interpreter [place], [place] used for Spanish translation of this visit   1. Type 2 Diabetes with hypertension, hyperlipidemia:  No hypoglycemic episodes.  Sometimes he feels a little rundown but this is rare.  He is compliant with metformin but sometimes misses the evening dose.  He is been try to get better at taking the 500 mg twice daily as directed.  Compliant with Lipitor.  Compliant with Lovaza.  Has seen cardiology and pain felt not to be cardiac in nature  Last eye exam: Had some type of eye exam but admits this was not a dilated diabetic eye exam to his knowledge. Last foot exam: Needs Last A1c:  Lab Results  Component Value Date   HGBA1C 7.3 (H) 02/19/2021   Nephropathy screen indicated?:  Up-to-date Last flu, zoster and/or pneumovax:  Immunization History  Administered Date(s) Administered   Hepatitis B 06/18/2017   Hepatitis B, adult 07/21/2017   Influenza Inj Mdck Quad With Preservative 11/01/2018   Influenza Split 10/31/2017   Influenza, High Dose Seasonal PF 01/02/2018   Influenza,inj,Quad PF,6+ Mos 11/06/2014, 11/03/2015, 11/23/2016   Influenza-Unspecified 12/31/2013, 01/02/2018   Moderna Sars-Covid-2 Vaccination 03/28/2019, 04/26/2019   Pneumococcal Conjugate-13 06/18/2017   Pneumococcal Polysaccharide-23 08/17/2018   Td 09/05/1994, 02/04/1996, 11/13/2012   Tdap 02/04/1996, 11/13/2012   2.  Bilateral knee pain Patient saw orthopedics last year for bilateral knee pain that is refractory to current NSAIDs and glucosamine-chondroitin supplement.  He was told to come back if he decided he wanted to pursue surgery and he thinks he is getting to that point.  Not sure if he needs a new referral so we will place this today  ROS: Per HPI  Not on File Past Medical History:  Diagnosis Date   Diabetes mellitus without complication  ([place])    Dyslipidemia    Hemorrhoids     Current Outpatient Medications:    atorvastatin (LIPITOR) 20 MG tablet, Take 1 tablet (20 mg total) by mouth daily., Disp: 90 tablet, Rfl: 3   diclofenac (VOLTAREN) 75 MG EC tablet, Take 1 tablet (75 mg total) by mouth 2 (two) times daily., Disp: 30 tablet, Rfl: 0   diclofenac Sodium (VOLTAREN) 1 % GEL, Apply 2 g topically 4 (four) times daily., Disp: 150 g, Rfl: PRN   glucosamine-chondroitin 500-400 MG tablet, Take 1 tablet by mouth 3 (three) times daily., Disp: , Rfl:    metFORMIN (GLUCOPHAGE) 500 MG tablet, Take 1 tablet (500 mg total) by mouth 2 (two) times daily with a meal., Disp: 180 tablet, Rfl: 3   methocarbamol (ROBAXIN) 500 MG tablet, Take 1 tablet (500 mg total) by mouth every 8 (eight) hours as needed for muscle spasms. (Patient not taking: Reported on 05/19/2021), Disp: 30 tablet, Rfl: 0   omega-3 acid ethyl esters (LOVAZA) 1 g capsule, Take by mouth 2 (two) times daily., Disp: , Rfl:  Social History   Socioeconomic History   Marital status: Single    Spouse name: Not on file   Number of children: Not on file   Years of education: Not on file   Highest education level: Not on file  Occupational History   Not on file  Tobacco Use   Smoking status: Former    Passive exposure: Past   Smokeless tobacco: Never  Vaping Use   Vaping Use: Never used  Substance and Sexual Activity  Alcohol use: No   Drug use: No   Sexual activity: Not on file  Other Topics Concern   Not on file  Social History Narrative   Still works.   Lives with friends.     Social Determinants of Health   Financial Resource Strain: Not on file  Food Insecurity: Not on file  Transportation Needs: Not on file  Physical Activity: Not on file  Stress: Not on file  Social Connections: Not on file  Intimate Partner Violence: Not on file   Family History  Problem Relation Age of Onset   Cancer Mother    Heart Problems Father    Stomach cancer Brother      Objective: Office vital signs reviewed. BP 115/71   Pulse 64   Temp 97.7 F (36.5 C)   Ht '5\' 3"'$  (1.6 m)   Wt 184 lb 12.8 oz (83.8 kg)   SpO2 96%   BMI 32.74 kg/m   Physical Examination:  General: Awake, alert, well nourished, No acute distress HEENT: Pterygium noted on the right eye.  Sclera white. Cardio: regular rate and rhythm, S1S2 heard, no murmurs appreciated Pulm: clear to auscultation bilaterally, no wheezes, rhonchi or rales; normal work of breathing on room air MSK: Slightly antalgic gait and station Skin: dry; intact; no rashes or lesions Neuro:  Diabetic Foot Exam - Simple   Simple Foot Form Diabetic Foot exam was performed with the following findings: Yes 07/02/2021 10:43 AM  Visual Inspection See comments: Yes Sensation Testing Intact to touch and monofilament testing bilaterally: Yes Pulse Check Posterior Tibialis and Dorsalis pulse intact bilaterally: Yes Comments Onychomycosis of the nails but sensation is intact with good pedal flow      Assessment/ Plan: 76 y.o. male   Type 2 diabetes mellitus with microalbuminuria, without long-term current use of insulin ([facility]) - Plan: Bayer DCA Hb A1c Waived, metFORMIN (GLUCOPHAGE-XR) 500 MG 24 hr tablet  Hyperlipidemia associated with type 2 diabetes mellitus ([place])  Uses Spanish as primary spoken language  Chronic pain of both knees - Plan: Ambulatory referral to Orthopedic Surgery  Given his difficulty remembering to take that second dose of metformin, will change him to the extended release for convenience.  His sugar was under excellent control with A1c down to 6.6 today.  We will set him up with [facility] for diabetic eye exam.  Diabetic foot exam was performed today and demonstrated onychomycosis but no sensory or vascular issues.  Continue statin, Lovaza for cholesterol control  New referral placed for orthopedic eval.  Sounds like he is ready for surgery  Orders Placed This Encounter  Procedures   Bayer  DCA Hb A1c Waived   No orders of the defined types were placed in this encounter.    Janora Norlander, DO [place] [phone]

## 2021-07-14 ENCOUNTER — Encounter: Payer: Self-pay | Admitting: Radiology

## 2021-08-04 ENCOUNTER — Ambulatory Visit: Payer: BC Managed Care – PPO | Admitting: Orthopedic Surgery

## 2021-08-04 ENCOUNTER — Encounter: Payer: Self-pay | Admitting: Orthopedic Surgery

## 2021-08-04 VITALS — BP 138/69 | HR 73 | Ht 63.0 in | Wt 187.0 lb

## 2021-08-04 DIAGNOSIS — M1712 Unilateral primary osteoarthritis, left knee: Secondary | ICD-10-CM

## 2021-08-04 DIAGNOSIS — M170 Bilateral primary osteoarthritis of knee: Secondary | ICD-10-CM

## 2021-08-04 DIAGNOSIS — M1711 Unilateral primary osteoarthritis, right knee: Secondary | ICD-10-CM

## 2021-08-04 NOTE — Patient Instructions (Signed)

## 2021-08-04 NOTE — Progress Notes (Signed)
Orthopaedic Clinic Return  Assessment: Tom [name] is a 76 y.o. male with the following: Right knee arthritis Left knee arthritis Varus alignment  Plan: Tom [name] has advanced arthritis in bilateral knees.  Pain is primarily within the medial compartment.  He has a varus alignment overall.  Medications are helpful, but not providing sustained relief.  His pain gets worse throughout the day, especially when he stands on it for extended period of time.  He is interested in injections, and bilateral knee injections are completed in clinic today.  Depending on the efficacy of the injections, we may have to discuss knee replacement in more detail, and have him see Tom [name] as a result.  Procedure note injection Right knee joint   Verbal consent was obtained to inject the right knee joint  Timeout was completed to confirm the site of injection.  The skin was prepped with alcohol and ethyl chloride was sprayed at the injection site.  A 21-gauge needle was used to inject 40 mg of Depo-Medrol and 1% lidocaine (3 cc) into the right knee using an anterolateral approach.  There were no complications. A sterile bandage was applied.  Procedure note injection Left knee joint   Verbal consent was obtained to inject the left knee joint  Timeout was completed to confirm the site of injection.  The skin was prepped with alcohol and ethyl chloride was sprayed at the injection site.  A 21-gauge needle was used to inject 40 mg of Depo-Medrol and 1% lidocaine (3 cc) into the left knee using an anterolateral approach.  There were no complications. A sterile bandage was applied.     Follow-up: Return if symptoms worsen or fail to improve.   Subjective:  Chief Complaint  Patient presents with   Knee Pain    Bilateral, No injury, both bothers him most after working all day, doing some natural remedies and cream from primary doctor gets mild relief    History of Present Illness: Tom [name] is a 76 y.o. male who returns to clinic for evaluation of his bilateral knees.  His left knee is a little bit worse than his right.  Pain is in the medial aspect of his knees.  Pain gets worse throughout the day, as he stands on his knees.  He has a documented history of arthritis.  No recent injury.  He has had prior injections of both knees, with good results.    Review of Systems: No fevers or chills No numbness or tingling No chest pain No shortness of breath No bowel or bladder dysfunction No GI distress No headaches  Objective: BP 138/69   Pulse 73   Ht '5\' 3"'$  (1.6 m)   Wt 187 lb (84.8 kg)   BMI 33.13 kg/m   Physical Exam:  Alert and oriented.  No acute distress.  Evaluation of bilateral knees demonstrates varus alignment.  Tenderness to palpation within the medial compartment bilaterally.  No effusion.  He is able to get just short of full extension, with flexion beyond 90 degrees.  IMAGING: I personally ordered and reviewed the following images:  Imaging obtained today.  Mordecai Rasmussen, MD 08/04/2021 9:34 AM

## 2021-09-10 ENCOUNTER — Encounter: Payer: Self-pay | Admitting: Nurse Practitioner

## 2021-09-10 ENCOUNTER — Ambulatory Visit (INDEPENDENT_AMBULATORY_CARE_PROVIDER_SITE_OTHER): Payer: BC Managed Care – PPO

## 2021-09-10 ENCOUNTER — Ambulatory Visit (INDEPENDENT_AMBULATORY_CARE_PROVIDER_SITE_OTHER): Payer: BC Managed Care – PPO | Admitting: Nurse Practitioner

## 2021-09-10 VITALS — BP 136/81 | HR 74 | Temp 98.4°F | Ht 63.0 in | Wt 189.0 lb

## 2021-09-10 DIAGNOSIS — M25512 Pain in left shoulder: Secondary | ICD-10-CM

## 2021-09-10 MED ORDER — DICLOFENAC SODIUM 75 MG PO TBEC: 75.0000 mg | DELAYED_RELEASE_TABLET | Freq: Two times a day (BID) | ORAL | 0 refills | Status: DC

## 2021-09-10 MED ORDER — METHOCARBAMOL 500 MG PO TABS: 500.0000 mg | ORAL_TABLET | Freq: Four times a day (QID) | ORAL | 1 refills | Status: DC

## 2021-09-10 NOTE — Patient Instructions (Signed)
Dolor en el hombro Shoulder Pain Muchas cosas pueden provocar dolor en el hombro, por ejemplo: Una lesin. Un movimiento del hombro que se repite [place] vez de la misma manera (uso excesivo). Dolor en las articulaciones (artritis). El dolor puede deberse a lo siguiente: Hinchazn e irritacin (inflamacin) de alguna parte del hombro. Una lesin en la articulacin del hombro. Una lesin en: Los tejidos que conectan el msculo al hueso (tendones). Los tejidos que [employer] s (ligamentos). Los [employer]. Siga estas indicaciones en su casa: Controle los cambios en sus sntomas. Informe a su mdico acerca de los cambios. Estas indicaciones pueden ayudarlo con Conservation officer, historic buildings. Si tiene un cabestrillo: Use el cabestrillo como se lo haya indicado el mdico. Quteselo solamente como se lo haya indicado el mdico. Afloje el cabestrillo si los dedos: Hormiguean. Se adormecen. Se tornan fros y de [employer]. Mantenga el cabestrillo limpio. Si el cabestrillo no es impermeable: No deje que se moje. Qutese el cabestrillo para ducharse o baarse. Control del dolor, la rigidez y la hinchazn  Si se lo indican, aplique hielo sobre la zona dolorida: Ponga el hielo en una bolsa plstica. Coloque una toalla entre la piel y Therapist, nutritional. Coloque el hielo durante 20 minutos, 2 a 3 veces por da. Deje de aplicarse hielo si no ayuda a Best boy. Apriete una pelota blanda o una almohadilla de goma tanto como sea posible. Esto impide que el hombro se hinche. Tambin ayuda a Veterinary surgeon. Indicaciones generales Use los medicamentos de venta libre y los recetados solamente como se lo haya indicado el mdico. Consulting civil engineer a todas las visitas de seguimiento como se lo haya indicado el mdico. Esto es importante. Comunquese con un mdico si: El Holiday representative. Los medicamentos no Forensic psychologist. Siente un dolor nuevo en el brazo, la mano o los dedos. Solicite ayuda inmediatamente  si: El brazo, la mano o los dedos: [place]. Estn adormecidos. Estn hinchados. Estn doloridos. Se tornan de color blanco o azul. Resumen Varias pueden ser las causas del dolor en el hombro. Estas incluyen lesiones, mover el hombro en el mismo sentido una y Elmon Kirschner, y Social research officer, government en las articulaciones. Controle los cambios en sus sntomas. Informe a su mdico acerca de los cambios. Esta afeccin se puede tratar con un cabestrillo, hielo y un medicamento para Conservation officer, historic buildings. Comunquese con su mdico si el dolor empeora o tiene un dolor nuevo. Solicite ayuda de inmediato si el brazo, la mano o los dedos se le adormecen o si siente hormigueo, se le hinchan o le duelen. Concurra a todas las visitas de seguimiento como se lo haya indicado el mdico. Esto es importante. Esta informacin no tiene Marine scientist el consejo del mdico. Asegrese de hacerle al mdico cualquier pregunta que tenga. Document Revised: 10/06/2020 Document Reviewed: 10/06/2020 Elsevier Patient Education  [place].

## 2021-09-10 NOTE — Addendum Note (Signed)
Addended by: Ivy Lynn on: 09/10/2021 12:37 PM   Modules accepted: Orders

## 2021-09-10 NOTE — Progress Notes (Signed)
Acute Office Visit  Subjective:     Patient ID: Tom [name], male    DOB: 11-12-1945, 76 y.o.   MRN: [id number]  Chief Complaint  Patient presents with   Shoulder Pain    [place] [address] off ladder     Shoulder Pain  The pain is present in the left shoulder. This is a new problem. The current episode started yesterday. There has been a history of trauma. The problem occurs constantly. The problem has been unchanged. The quality of the pain is described as aching. The pain is at a severity of 8/10. Associated symptoms include a limited range of motion. Pertinent negatives include no itching. The symptoms are aggravated by activity. He has tried nothing for the symptoms.    Review of Systems  Constitutional: Negative.   HENT: Negative.    Eyes: Negative.   Respiratory: Negative.    Cardiovascular: Negative.   Genitourinary: Negative.   Musculoskeletal:  Positive for joint pain.  Skin: Negative.  Negative for itching and rash.  All other systems reviewed and are negative.       Objective:    BP 136/81   Pulse 74   Temp 98.4 F (36.9 C)   Ht '5\' 3"'$  (1.6 m)   Wt 189 lb (85.7 kg)   SpO2 95%   BMI 33.48 kg/m  BP Readings from Last 3 Encounters:  09/10/21 136/81  08/04/21 138/69  07/02/21 115/71   Wt Readings from Last 3 Encounters:  09/10/21 189 lb (85.7 kg)  08/04/21 187 lb (84.8 kg)  07/02/21 184 lb 12.8 oz (83.8 kg)      Physical Exam Vitals and nursing note reviewed.  Constitutional:      Appearance: Normal appearance.  HENT:     Head: Normocephalic.     Right Ear: External ear normal.     Left Ear: External ear normal.     Nose: Nose normal.     Mouth/Throat:     Mouth: Mucous membranes are moist.     Pharynx: Oropharynx is clear.  Eyes:     Conjunctiva/sclera: Conjunctivae normal.  Cardiovascular:     Rate and Rhythm: Normal rate and regular rhythm.     Pulses: Normal pulses.     Heart sounds: Normal heart sounds.  Pulmonary:     Effort: Pulmonary  effort is normal.     Breath sounds: Normal breath sounds.  Abdominal:     General: Bowel sounds are normal.  Musculoskeletal:     Left shoulder: Tenderness present. No swelling. Decreased range of motion.       Arms:     Comments: Left shoulder pain and tenderness  Skin:    General: Skin is warm.     Findings: No erythema or rash.  Neurological:     Mental Status: He is alert and oriented to person, place, and time.     No results found for any visits on 09/10/21.      Assessment & Plan:  Left shoulder pain not well controlled, 40 DepoMedrol shot given in clinic, ice, warm compress as tolerated, continue Voltaren 75 mg tablet by mouth as prescribed, continue Robaxin 500 mg tablet by mouth twice daily.   Follow up with worsening or unresolved symptoms.   Problem List Items Addressed This Visit   None Visit Diagnoses     Acute pain of left shoulder    -  Primary   Relevant Orders   DG Shoulder Left       No orders  of the defined types were placed in this encounter.   Return if symptoms worsen or fail to improve.  Ivy Lynn, NP

## 2021-10-21 ENCOUNTER — Ambulatory Visit (INDEPENDENT_AMBULATORY_CARE_PROVIDER_SITE_OTHER): Payer: BC Managed Care – PPO | Admitting: Family Medicine

## 2021-10-21 ENCOUNTER — Encounter: Payer: Self-pay | Admitting: Family Medicine

## 2021-10-21 DIAGNOSIS — R0981 Nasal congestion: Secondary | ICD-10-CM

## 2021-10-21 NOTE — Progress Notes (Signed)
Virtual Visit via telephone Note  I connected with Tom [name] on 10/21/21 at 1526 by telephone and verified that I am speaking with the correct person using two identifiers. Tom [name] is currently located at home and  wife  are currently with her during visit. The provider, Fransisca Kaufmann Wanisha Shiroma, MD is located in their office at time of visit.  Call ended at 1532  I discussed the limitations, risks, security and privacy concerns of performing an evaluation and management service by telephone and the availability of in person appointments. I also discussed with the patient that there may be a patient responsible charge related to this service. The patient expressed understanding and agreed to proceed.   History and Present Illness: Patient awoke today with cough and congestion and headache. He is feeling better but still needs a work note.  He feels like his congestion is better and still has a little bit of cough but the sinus pressure and headache is In the today and he thinks we will keep him out tomorrow.  Then he does not have to go back to work till Monday.  1. Sinus congestion     Outpatient Encounter Medications as of 10/21/2021  Medication Sig   atorvastatin (LIPITOR) 20 MG tablet Take 1 tablet (20 mg total) by mouth daily.   diclofenac (VOLTAREN) 75 MG EC tablet Take 1 tablet (75 mg total) by mouth 2 (two) times daily.   diclofenac Sodium (VOLTAREN) 1 % GEL Apply 2 g topically 4 (four) times daily.   glucosamine-chondroitin 500-400 MG tablet Take 1 tablet by mouth 3 (three) times daily.   metFORMIN (GLUCOPHAGE-XR) 500 MG 24 hr tablet Take 1 tablet (500 mg total) by mouth daily with breakfast. Please replace the IR metformin.  SPANISH SIG   methocarbamol (ROBAXIN) 500 MG tablet Take 1 tablet (500 mg total) by mouth every 8 (eight) hours as needed for muscle spasms.   methocarbamol (ROBAXIN) 500 MG tablet Take 1 tablet (500 mg total) by mouth 4 (four) times daily.   omega-3  acid ethyl esters (LOVAZA) 1 g capsule Take by mouth 2 (two) times daily.   No facility-administered encounter medications on file as of 10/21/2021.    Review of Systems  Constitutional:  Negative for chills and fever.  HENT:  Positive for congestion and sinus pressure. Negative for ear discharge, ear pain, postnasal drip, rhinorrhea, sneezing, sore throat and voice change.   Eyes:  Negative for pain, discharge, redness and visual disturbance.  Respiratory:  Positive for cough. Negative for shortness of breath and wheezing.   Cardiovascular:  Negative for chest pain and leg swelling.  Musculoskeletal:  Negative for gait problem.  Skin:  Negative for rash.  Neurological:  Positive for headaches.  All other systems reviewed and are negative.   Observations/Objective: Patient sounds comfortable and in no acute distress  Assessment and Plan: Problem List Items Addressed This Visit   None Visit Diagnoses     Sinus congestion    -  Primary       Continue to treat conservatively, gave a letter right amount for today and tomorrow from work and can return Monday unless anything worsens give [place] a call back Follow up plan: Return if symptoms worsen or fail to improve.     I discussed the assessment and treatment plan with the patient. The patient was provided an opportunity to ask questions and all were answered. The patient agreed with the plan and demonstrated an understanding of the instructions.  The patient was advised to call back or seek an in-person evaluation if the symptoms worsen or if the condition fails to improve as anticipated.  The above assessment and management plan was discussed with the patient. The patient verbalized understanding of and has agreed to the management plan. Patient is aware to call the clinic if symptoms persist or worsen. Patient is aware when to return to the clinic for a follow-up visit. Patient educated on when it is appropriate to go to the  emergency department.    I provided 6 minutes of non-face-to-face time during this encounter.    Worthy Rancher, MD

## 2021-11-12 ENCOUNTER — Ambulatory Visit (INDEPENDENT_AMBULATORY_CARE_PROVIDER_SITE_OTHER): Payer: BC Managed Care – PPO | Admitting: Orthopedic Surgery

## 2021-11-12 ENCOUNTER — Ambulatory Visit: Payer: BC Managed Care – PPO | Admitting: Family Medicine

## 2021-11-12 ENCOUNTER — Encounter: Payer: Self-pay | Admitting: Orthopedic Surgery

## 2021-11-12 DIAGNOSIS — M21161 Varus deformity, not elsewhere classified, right knee: Secondary | ICD-10-CM | POA: Diagnosis not present

## 2021-11-12 DIAGNOSIS — M1711 Unilateral primary osteoarthritis, right knee: Secondary | ICD-10-CM | POA: Diagnosis not present

## 2021-11-12 DIAGNOSIS — M21162 Varus deformity, not elsewhere classified, left knee: Secondary | ICD-10-CM

## 2021-11-12 DIAGNOSIS — M1712 Unilateral primary osteoarthritis, left knee: Secondary | ICD-10-CM

## 2021-11-12 MED ORDER — METHYLPREDNISOLONE ACETATE 40 MG/ML IJ SUSP
40.0000 mg | Freq: Once | INTRAMUSCULAR | Status: AC
  Administered 2021-11-12: 40 mg via INTRA_ARTICULAR

## 2021-11-12 NOTE — Patient Instructions (Signed)

## 2021-11-13 ENCOUNTER — Encounter: Payer: Self-pay | Admitting: Orthopedic Surgery

## 2021-11-13 NOTE — Progress Notes (Signed)
Orthopaedic Clinic Return  Assessment: Tom [name] is a 76 y.o. male with the following: Right knee arthritis Left knee arthritis Varus alignment  Plan: [name] has advanced arthritis in bilateral knees.  Injections have been successful.  He would like to proceed with bilateral injections again.   Procedure note injection Right knee joint   Verbal consent was obtained to inject the right knee joint  Timeout was completed to confirm the site of injection.  The skin was prepped with alcohol and ethyl chloride was sprayed at the injection site.  A 21-gauge needle was used to inject 40 mg of Depo-Medrol and 1% lidocaine (3 cc) into the right knee using an anterolateral approach.  There were no complications. A sterile bandage was applied.  Procedure note injection Left knee joint   Verbal consent was obtained to inject the left knee joint  Timeout was completed to confirm the site of injection.  The skin was prepped with alcohol and ethyl chloride was sprayed at the injection site.  A 21-gauge needle was used to inject 40 mg of Depo-Medrol and 1% lidocaine (3 cc) into the left knee using an anterolateral approach.  There were no complications. A sterile bandage was applied.     Follow-up: Return if symptoms worsen or fail to improve.   Subjective:  Chief Complaint  Patient presents with   Knee Pain    Bilateral knee pain    Injections    Both knees    History of Present Illness: Tom [name] is a 76 y.o. male who returns to clinic for evaluation of his bilateral knees.  Pain is medial.  No recent injuries.  Injections have been helpful, providing relief for 2-3 months.  He is not interested in surgery.     Review of Systems: No fevers or chills No numbness or tingling No chest pain No shortness of breath No bowel or bladder dysfunction No GI distress No headaches  Objective: There were no vitals taken for this visit.  Physical Exam:  Alert and  oriented.  No acute distress.  Evaluation of bilateral knees demonstrates varus alignment.  Tenderness to palpation within the medial compartment bilaterally.  No effusion.  He is able to get just short of full extension, with flexion beyond 90 degrees.  IMAGING: I personally ordered and reviewed the following images:  No new imaging obtained today.  Mordecai Rasmussen, MD 11/13/2021 9:01 PM

## 2021-11-23 ENCOUNTER — Ambulatory Visit: Payer: BC Managed Care – PPO | Admitting: Nurse Practitioner

## 2021-11-23 ENCOUNTER — Encounter: Payer: Self-pay | Admitting: Nurse Practitioner

## 2021-11-23 VITALS — BP 139/78 | HR 71 | Temp 98.9°F | Ht 63.0 in | Wt 181.0 lb

## 2021-11-23 DIAGNOSIS — M25512 Pain in left shoulder: Secondary | ICD-10-CM

## 2021-11-23 DIAGNOSIS — M791 Myalgia, unspecified site: Secondary | ICD-10-CM

## 2021-11-23 MED ORDER — DICLOFENAC SODIUM 75 MG PO TBEC: 75.0000 mg | DELAYED_RELEASE_TABLET | Freq: Two times a day (BID) | ORAL | 0 refills | Status: DC

## 2021-11-23 MED ORDER — METHYLPREDNISOLONE ACETATE 40 MG/ML IJ SUSP
40.0000 mg | Freq: Once | INTRAMUSCULAR | Status: AC
  Administered 2021-11-23: 40 mg via INTRAMUSCULAR

## 2021-11-23 MED ORDER — PREDNISONE 20 MG PO TABS: 20.0000 mg | ORAL_TABLET | Freq: Every day | ORAL | 0 refills | Status: DC

## 2021-11-23 MED ORDER — METHOCARBAMOL 500 MG PO TABS: 500.0000 mg | ORAL_TABLET | Freq: Three times a day (TID) | ORAL | 0 refills | Status: DC | PRN

## 2021-11-23 NOTE — Progress Notes (Signed)
Acute Office Visit  Subjective:     Patient ID: Tom [name], male    DOB: Sep 09, 1945, 76 y.o.   MRN: [id number]  No chief complaint on file.   Shoulder Pain  The pain is present in the right shoulder. This is a recurrent problem. The current episode started more than 1 month ago. The problem occurs constantly. The problem has been unchanged. The quality of the pain is described as aching. The pain is at a severity of 10/10. The pain is severe. Associated symptoms include stiffness. Pertinent negatives include no fever, inability to bear weight, numbness or tingling. The symptoms are aggravated by activity. He has tried NSAIDS for the symptoms. The treatment provided mild relief.     Review of Systems  Constitutional: Negative.  Negative for chills and fever.  HENT: Negative.    Respiratory: Negative.    Cardiovascular: Negative.   Musculoskeletal:  Positive for stiffness.       Right shoulder pain  Skin: Negative.  Negative for rash.  Neurological:  Negative for tingling and numbness.  All other systems reviewed and are negative.       Objective:    BP 139/78   Pulse 71   Temp 98.9 F (37.2 C)   Ht '5\' 3"'$  (1.6 m)   Wt 181 lb (82.1 kg)   SpO2 96%   BMI 32.06 kg/m  BP Readings from Last 3 Encounters:  11/23/21 139/78  09/10/21 136/81  08/04/21 138/69      Physical Exam Vitals and nursing note reviewed.  Constitutional:      Appearance: Normal appearance.  HENT:     Right Ear: External ear normal.     Left Ear: External ear normal.     Nose: Nose normal.  Eyes:     Conjunctiva/sclera: Conjunctivae normal.  Cardiovascular:     Rate and Rhythm: Normal rate and regular rhythm.     Pulses: Normal pulses.     Heart sounds: Normal heart sounds.  Pulmonary:     Effort: Pulmonary effort is normal.     Breath sounds: Normal breath sounds.  Abdominal:     General: Bowel sounds are normal.  Musculoskeletal:     Right shoulder: Tenderness present. No  swelling. Decreased range of motion.       Arms:     Comments: Right shoulder pain  Skin:    General: Skin is warm.     Findings: No erythema or rash.  Neurological:     General: No focal deficit present.     Mental Status: He is alert and oriented to person, place, and time.     No results found for any visits on 11/23/21.      Assessment & Plan:  Recurrent right shoulder pain.  Symptoms present for over 4 weeks.  Patient completed right shoulder x-ray about 6 weeks ago that showed no fracture or acute abnormalities.  Advised patient to rest shoulders, continue Robaxin, apply 1/ice compress as tolerated, anti-inflammatory.  40 Depo-Medrol shot given in clinic.  Completed referral to physical therapy.  Follow-up with unresolved symptoms. Problem List Items Addressed This Visit   None Visit Diagnoses     Muscle pain       Relevant Medications   methocarbamol (ROBAXIN) 500 MG tablet   Acute pain of left shoulder       Relevant Medications   diclofenac (VOLTAREN) 75 MG EC tablet   predniSONE (DELTASONE) 20 MG tablet   Other Relevant Orders  Ambulatory referral to Physical Therapy       Meds ordered this encounter  Medications   methocarbamol (ROBAXIN) 500 MG tablet    Sig: Take 1 tablet (500 mg total) by mouth every 8 (eight) hours as needed for muscle spasms.    Dispense:  30 tablet    Refill:  0    Order Specific Question:   Supervising Provider    Answer:   Jeneen Rinks   diclofenac (VOLTAREN) 75 MG EC tablet    Sig: Take 1 tablet (75 mg total) by mouth 2 (two) times daily.    Dispense:  30 tablet    Refill:  0    Order Specific Question:   Supervising Provider    Answer:   Jeneen Rinks   predniSONE (DELTASONE) 20 MG tablet    Sig: Take 1 tablet (20 mg total) by mouth daily with breakfast.    Dispense:  6 tablet    Refill:  0    Order Specific Question:   Supervising Provider    Answer:   Jeneen Rinks    Return if symptoms  worsen or fail to improve.  Ivy Lynn, NP

## 2021-11-23 NOTE — Patient Instructions (Signed)

## 2021-11-30 ENCOUNTER — Other Ambulatory Visit: Payer: Self-pay

## 2021-11-30 ENCOUNTER — Ambulatory Visit: Payer: BC Managed Care – PPO | Attending: Nurse Practitioner

## 2021-11-30 DIAGNOSIS — M25511 Pain in right shoulder: Secondary | ICD-10-CM | POA: Insufficient documentation

## 2021-11-30 DIAGNOSIS — M25512 Pain in left shoulder: Secondary | ICD-10-CM | POA: Diagnosis not present

## 2021-11-30 DIAGNOSIS — M6281 Muscle weakness (generalized): Secondary | ICD-10-CM | POA: Insufficient documentation
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
## 2021-12-01 ENCOUNTER — Encounter: Payer: Self-pay | Admitting: Family Medicine

## 2021-12-01 ENCOUNTER — Ambulatory Visit (INDEPENDENT_AMBULATORY_CARE_PROVIDER_SITE_OTHER): Payer: BC Managed Care – PPO | Admitting: Family Medicine

## 2021-12-01 VITALS — BP 124/77 | HR 77 | Temp 98.3°F | Ht 63.0 in | Wt 189.2 lb

## 2021-12-01 DIAGNOSIS — E1169 Type 2 diabetes mellitus with other specified complication: Secondary | ICD-10-CM | POA: Diagnosis not present

## 2021-12-01 DIAGNOSIS — M25511 Pain in right shoulder: Secondary | ICD-10-CM

## 2021-12-01 DIAGNOSIS — Z789 Other specified health status: Secondary | ICD-10-CM

## 2021-12-01 DIAGNOSIS — E1129 Type 2 diabetes mellitus with other diabetic kidney complication: Secondary | ICD-10-CM | POA: Diagnosis not present

## 2021-12-01 DIAGNOSIS — Z23 Encounter for immunization: Secondary | ICD-10-CM | POA: Diagnosis not present

## 2021-12-01 DIAGNOSIS — R809 Proteinuria, unspecified: Secondary | ICD-10-CM

## 2021-12-01 DIAGNOSIS — E785 Hyperlipidemia, unspecified: Secondary | ICD-10-CM

## 2021-12-01 NOTE — Patient Instructions (Signed)
el azcar tiene buena pinta. sin cambios de medicacin. Siga tomando el medicamento para el colesterol y la meformina segn lo recetado. regrese en 6 meses para una nueva revisin y le haremos un examen fsico con laboratorios de [place], as que no coma durante 8 horas antes de esa cita.

## 2021-12-01 NOTE — Progress Notes (Signed)
Subjective: CC: Follow-up diabetes PCP: Janora Norlander, DO MPN:Tom [name] is a 76 y.o. male presenting to clinic today for:  [employer] video interpreter Shanon Brow, [place] used for spanish translation of this visit   1. Type 2 Diabetes with hyperlipidemia:  Patient is compliant with his metformin extended release.  He takes Lipitor as well as Lovaza.  No chest pain, shortness of breath, visual disturbance.  Does not need refills.  Denies any symptoms of hyper or hypoglycemia including polydipsia, polyuria  Last eye exam: Needs Last foot exam: Up-to-date Last A1c:  Lab Results  Component Value Date   HGBA1C 6.6 (H) 07/02/2021   Nephropathy screen indicated?:  Up-to-date Last flu, zoster and/or pneumovax:  Immunization History  Administered Date(s) Administered   Fluad Quad(high Dose 65+) 12/01/2021   Hepatitis B 06/18/2017   Hepatitis B, adult 07/21/2017   Influenza Inj Mdck Quad With Preservative 11/01/2018   Influenza Split 10/31/2017   Influenza, High Dose Seasonal PF 01/02/2018   Influenza,inj,Quad PF,6+ Mos 11/06/2014, 11/03/2015, 11/23/2016   Influenza-Unspecified 12/31/2013, 01/02/2018   Moderna Sars-Covid-2 Vaccination 03/28/2019, 04/26/2019   Pneumococcal Conjugate-13 06/18/2017   Pneumococcal Polysaccharide-23 08/17/2018   Td 09/05/1994, 02/04/1996, 11/13/2012   Td (Adult), 2 Lf Tetanus Toxid, Preservative Free 09/05/1994, 11/13/2012   Tdap 02/04/1996, 11/13/2012    2.  Shoulder pain Patient is under the care of physical therapy [place] for shoulder pain.  This is on the right side.  He feels like there is a right sided knot that is more prominent on the right than the left.  He does feel like the physical therapy is helping some.  He has noticed improvement also with topical Voltaren gel in this area.  Uses Robaxin only if needed.  He has not had any shots in that shoulder but does have an orthopedist that works on his knees regularly.  He admits that the  physical therapy is somewhat expensive and would like to see if there is anything else he could be doing to reduce this cost.  He is not sure that he can afford to $100 per week    ROS: Per HPI  No Known Allergies Past Medical History:  Diagnosis Date   Diabetes mellitus without complication (HCC)    Dyslipidemia    Hemorrhoids     Current Outpatient Medications:    atorvastatin (LIPITOR) 20 MG tablet, Take 1 tablet (20 mg total) by mouth daily., Disp: 90 tablet, Rfl: 3   diclofenac (VOLTAREN) 75 MG EC tablet, Take 1 tablet (75 mg total) by mouth 2 (two) times daily., Disp: 30 tablet, Rfl: 0   diclofenac Sodium (VOLTAREN) 1 % GEL, Apply 2 g topically 4 (four) times daily., Disp: 150 g, Rfl: PRN   glucosamine-chondroitin 500-400 MG tablet, Take 1 tablet by mouth 3 (three) times daily., Disp: , Rfl:    metFORMIN (GLUCOPHAGE-XR) 500 MG 24 hr tablet, Take 1 tablet (500 mg total) by mouth daily with breakfast. Please replace the IR metformin.  SPANISH SIG, Disp: 90 tablet, Rfl: 3   methocarbamol (ROBAXIN) 500 MG tablet, Take 1 tablet (500 mg total) by mouth 4 (four) times daily., Disp: 30 tablet, Rfl: 1   methocarbamol (ROBAXIN) 500 MG tablet, Take 1 tablet (500 mg total) by mouth every 8 (eight) hours as needed for muscle spasms., Disp: 30 tablet, Rfl: 0   omega-3 acid ethyl esters (LOVAZA) 1 g capsule, Take by mouth 2 (two) times daily., Disp: , Rfl:  Social History   Socioeconomic History  Marital status: Single    Spouse name: Not on file   Number of children: Not on file   Years of education: Not on file   Highest education level: Not on file  Occupational History   Not on file  Tobacco Use   Smoking status: Former    Passive exposure: Past   Smokeless tobacco: Never  Vaping Use   Vaping Use: Never used  Substance and Sexual Activity   Alcohol use: No   Drug use: No   Sexual activity: Not on file  Other Topics Concern   Not on file  Social History Narrative   Still  works.   Lives with friends.     Social Determinants of Health   Financial Resource Strain: Not on file  Food Insecurity: Not on file  Transportation Needs: Not on file  Physical Activity: Not on file  Stress: Not on file  Social Connections: Not on file  Intimate Partner Violence: Not on file   Family History  Problem Relation Age of Onset   Cancer Mother    Heart Problems Father    Stomach cancer Brother     Objective: Office vital signs reviewed. BP 124/77   Pulse 77   Temp 98.3 F (36.8 C)   Ht '5\' 3"'$  (1.6 m)   Wt 189 lb 3.2 oz (85.8 kg)   SpO2 94%   BMI 33.52 kg/m   Physical Examination:  General: Awake, alert, well nourished, No acute distress HEENT: Pterygium present. Cardio: regular rate and rhythm, S1S2 heard, no murmurs appreciated Pulm: clear to auscultation bilaterally, no wheezes, rhonchi or rales; normal work of breathing on room air MSK: Definitely has a palpable fullness over the [facility] joint on the right compared to the left.  This is the area of discomfort.  Assessment/ Plan: 76 y.o. male   Type 2 diabetes mellitus with microalbuminuria, without long-term current use of insulin (HCC) - Plan: Bayer DCA Hb A1c Waived  Hyperlipidemia associated with type 2 diabetes mellitus ([place])  Need for immunization against influenza - Plan: Flu Vaccine QUAD High Dose(Fluad)  Acute pain of right shoulder  Uses Spanish as primary spoken language  Sugar under acceptable control with A1c of 7.0.  No changes  Cholesterol medications.  Not yet due for fasting lipid  Influenza vaccination administered  Questionable AC involvement in that right shoulder pain.  Continue physical therapy as directed.  Consider follow-up with orthopedics for directed injection in that area.  Orders Placed This Encounter  Procedures   Flu Vaccine QUAD High Dose(Fluad)   Bayer DCA Hb A1c Waived   No orders of the defined types were placed in this encounter.    Janora Norlander,  DO [place] [phone]

## 2021-12-02 LAB — BAYER DCA HB A1C WAIVED: HB A1C (BAYER DCA - WAIVED): 7 % — ABNORMAL HIGH (ref 4.8–5.6)

## 2022-02-02 DIAGNOSIS — H10013 Acute follicular conjunctivitis, bilateral: Secondary | ICD-10-CM | POA: Diagnosis not present

## 2022-02-27 ENCOUNTER — Other Ambulatory Visit: Payer: Self-pay | Admitting: Family Medicine

## 2022-02-27 DIAGNOSIS — E1169 Type 2 diabetes mellitus with other specified complication: Secondary | ICD-10-CM

## 2022-03-22 ENCOUNTER — Ambulatory Visit: Payer: BC Managed Care – PPO | Admitting: Orthopedic Surgery

## 2022-03-22 ENCOUNTER — Encounter: Payer: Self-pay | Admitting: Orthopedic Surgery

## 2022-03-22 ENCOUNTER — Ambulatory Visit (INDEPENDENT_AMBULATORY_CARE_PROVIDER_SITE_OTHER): Payer: BC Managed Care – PPO

## 2022-03-22 VITALS — BP 132/74 | HR 71 | Ht 63.0 in | Wt 181.0 lb

## 2022-03-22 DIAGNOSIS — M545 Low back pain, unspecified: Secondary | ICD-10-CM

## 2022-03-22 DIAGNOSIS — M5416 Radiculopathy, lumbar region: Secondary | ICD-10-CM

## 2022-03-22 NOTE — Patient Instructions (Signed)
Referral to back specialist - Dr. Laurance Flatten  Instructions Following Joint Injections  In clinic today, you received an injection in one of your joints (sometimes more than one).  Occasionally, you can have some pain at the injection site, this is normal.  You can place ice at the injection site, or take over-the-counter medications such as Tylenol (acetaminophen) or Advil (ibuprofen).  Please follow all directions listed on the bottle.  If your joint (knee or shoulder) becomes swollen, red or very painful, please contact the clinic for additional assistance.   Two medications were injected, including lidocaine and a steroid (often referred to as cortisone).  Lidocaine is effective almost immediately but wears off quickly.  However, the steroid can take a few days to improve your symptoms.  In some cases, it can make your pain worse for a couple of days.  Do not be concerned if this happens as it is common.  You can apply ice or take some over-the-counter medications as needed.    Out of work for 2 weeks; please provide a letter

## 2022-03-23 ENCOUNTER — Encounter: Payer: Self-pay | Admitting: Orthopedic Surgery

## 2022-03-23 NOTE — Progress Notes (Signed)
Orthopaedic Clinic Return  Assessment: Klay Quisenberry is a 77 y.o. male with the following: Lumbar back pain, with left-sided radiculopathy Left knee arthritis  Plan: Mr. Juby presents with recent onset of acute worsening lower back pain, with radiculopathy into the left calf.  He has had issues similar to this in the past.  Radiographs obtained in clinic today demonstrates anterolisthesis at L5-S1.  This was explained to the patient.  He had questions regarding treatment including surgery.  He is also requested to be out of work in order to help with his lower back pain.  Given the radiographic findings, and my limited ability to provide answers to his questions, I recommended a referral to spine surgery.  He states understanding.  He is in agreement with this plan.  Regarding his left knee, he continues to have severe pain.  Pain is isolated to the medial aspect of his knee.  Radiographs previously demonstrate complete loss of joint space, with varus alignment to the knee.  He has done well with injections in the past.  He would like to proceed with a repeat injection.  This was completed in clinic today.  He will follow-up with me as needed.  Provided a work note, restricting him for the next 2 weeks.  Today's visit was completed with the assistance of a Spanish interpreter.   Procedure note injection Left knee joint   Verbal consent was obtained to inject the left knee joint  Timeout was completed to confirm the site of injection.  The skin was prepped with alcohol and ethyl chloride was sprayed at the injection site.  A 21-gauge needle was used to inject 40 mg of Depo-Medrol and 1% lidocaine (3 cc) into the left knee using an anterolateral approach.  There were no complications. A sterile bandage was applied.      Follow-up: Return if symptoms worsen or fail to improve.   Subjective:  Chief Complaint  Patient presents with   Back Pain    Lt side LBP for 2-3 wks. Pain  radiates down to the left calf.     History of Present Illness: Zmari Ranger is a 77 y.o. male who returns to clinic for evaluation of low back pain.  He is well-known to my clinic.  Approximate 2-3 weeks ago, he started to have lower back pain, with symptoms radiating into his left leg.  The symptoms progressed to the left calf area.  Minimal numbness and tingling.  He has had issues with his back in the past.  It is making work difficult.  He also notes that he has continued pain in the medial aspect of the left knee.  He has not had an injection for a while.  He is close to considering surgery.  Review of Systems: No fevers or chills No numbness or tingling No chest pain No shortness of breath No bowel or bladder dysfunction No GI distress No headaches   Objective: BP 132/74   Pulse 71   Ht 5' 3"$  (1.6 m)   Wt 181 lb (82.1 kg)   BMI 32.06 kg/m   Physical Exam:  And oriented.  No acute distress.  Tenderness to palpation across lower back.  Positive right leg raise on the left.  He has good strength.  2+ patellar tendon reflex.  Sensation intact throughout the left lower leg.  Tenderness palpation to medial aspect of the left knee.  Varus alignment overall.  IMAGING: I personally ordered and reviewed the following images:  Standing lumbar spine  x-rays were obtained in clinic today.  Mild thoracolumbar scoliosis.  There are degenerative changes noted throughout the lumbar spine.  Anterior osteophytes are appreciated.  At L5-S1 there is anterolisthesis, close to 50% translation.  Impression: Lumbar spine x-rays with degenerative changes and anterolisthesis at L5-S1   Mordecai Rasmussen, MD 03/23/2022 9:49 AM

## 2022-04-18 ENCOUNTER — Other Ambulatory Visit (INDEPENDENT_AMBULATORY_CARE_PROVIDER_SITE_OTHER): Payer: BC Managed Care – PPO

## 2022-04-18 ENCOUNTER — Ambulatory Visit (INDEPENDENT_AMBULATORY_CARE_PROVIDER_SITE_OTHER): Payer: BC Managed Care – PPO | Admitting: Orthopedic Surgery

## 2022-04-18 VITALS — BP 154/87 | HR 76 | Ht 63.0 in | Wt 181.0 lb

## 2022-04-18 DIAGNOSIS — M5416 Radiculopathy, lumbar region: Secondary | ICD-10-CM

## 2022-04-18 DIAGNOSIS — M810 Age-related osteoporosis without current pathological fracture: Secondary | ICD-10-CM | POA: Diagnosis not present

## 2022-04-18 MED ORDER — PREGABALIN 50 MG PO CAPS: 50.0000 mg | ORAL_CAPSULE | Freq: Three times a day (TID) | ORAL | 1 refills | Status: DC

## 2022-04-18 MED ORDER — ACETAMINOPHEN 500 MG PO TABS: 1000.0000 mg | ORAL_TABLET | Freq: Three times a day (TID) | ORAL | 1 refills | Status: AC | PRN
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
## 2022-04-23 IMAGING — MR MR KNEE*L* W/O CM
7 series · 40 of 40 positions shown · non-contrast
Comparison: None.

CLINICAL DATA: Chronic left knee pain.  No known injury.

EXAM:
MRI OF THE LEFT KNEE WITHOUT CONTRAST
TECHNIQUE: Multiplanar, multisequence MR imaging of the knee was performed. No
intravenous contrast was administered.

[Series 8: T2 fat-sat · axial · left · 4.0mm · 0.47mm/px · z∈[-31,+93]mm · 5 of 26 slices shown (1 of 3)]
[im 1/26]
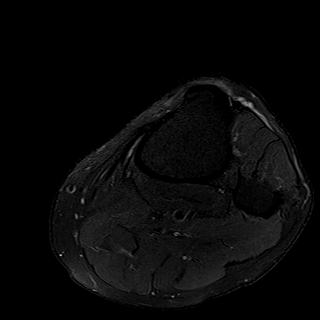
[im 7/26]
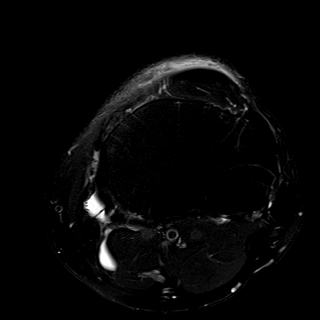
[im 13/26]
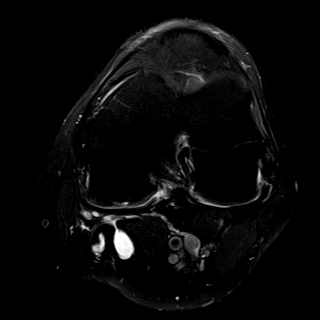
[im 19/26]
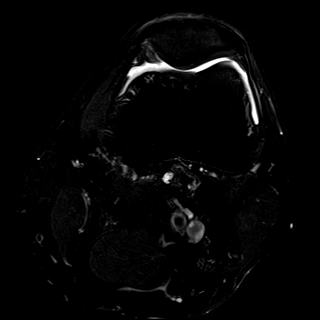
[im 26/26]
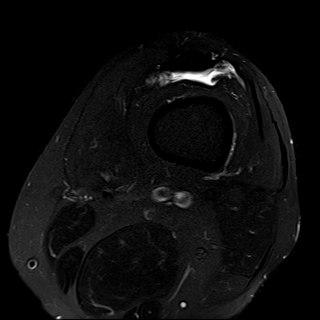

[Series 9: T1 · coronal · left · 4.0mm · 0.59mm/px · 5 of 24 slices shown]
[im 1/24]
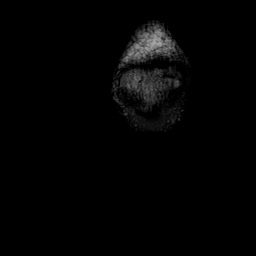
[im 6/24]
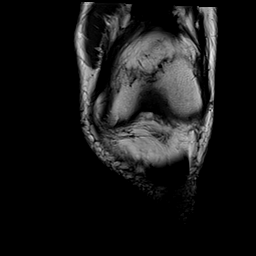
[im 12/24]
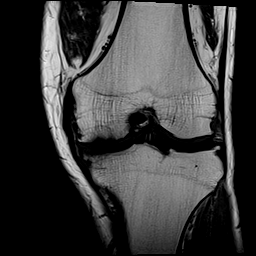
[im 18/24]
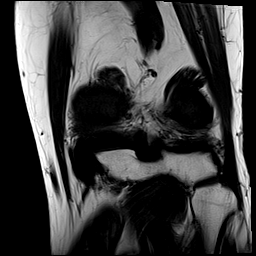
[im 24/24]
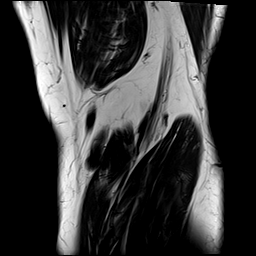

[Series 10: T2 fat-sat · coronal · left · 4.0mm · 0.59mm/px · 7 of 28 slices shown (2 of 3)]
[im 1/28]
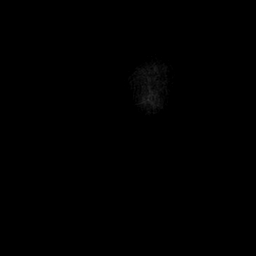
[im 5/28]
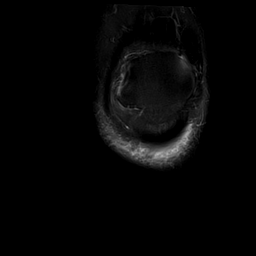
[im 10/28]
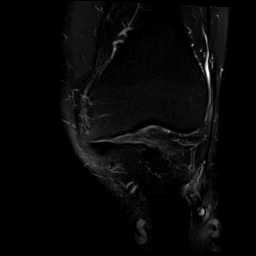
[im 14/28]
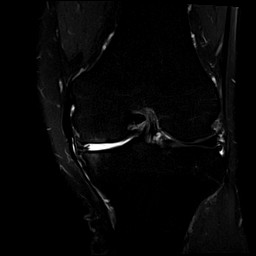
[im 19/28]
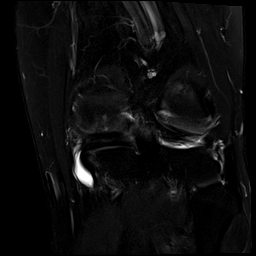
[im 23/28]
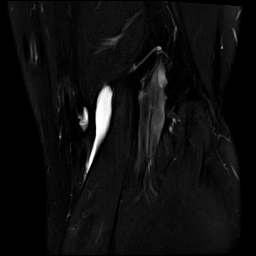
[im 28/28]
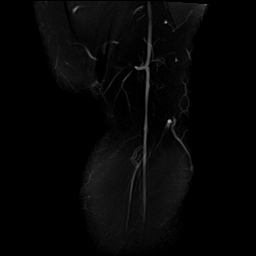

[Series 11: PD fat-sat · coronal · left · 4.0mm · 0.59mm/px · 7 of 28 slices shown (1 of 2)]
[im 1/28]
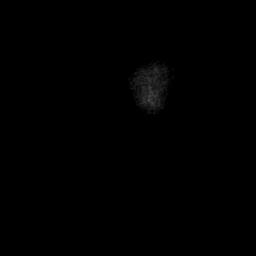
[im 5/28]
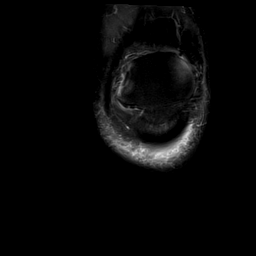
[im 10/28]
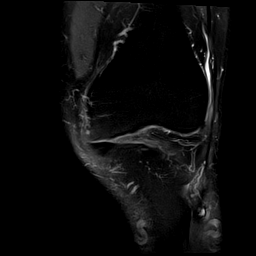
[im 14/28]
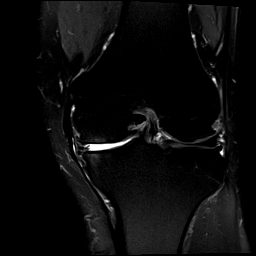
[im 19/28]
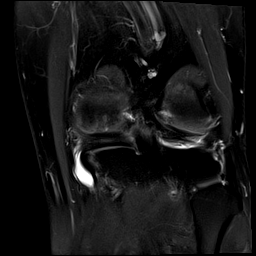
[im 23/28]
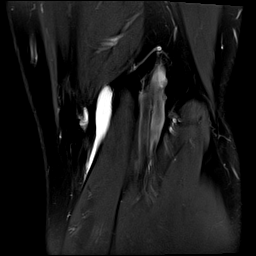
[im 28/28]
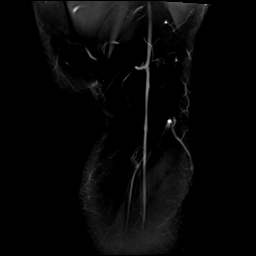

[Series 12: PD fat-sat · sagittal · left · 3.0mm · 0.59mm/px · 7 of 28 slices shown (2 of 2)]
[im 1/28]
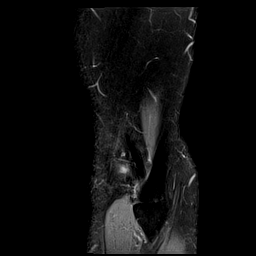
[im 5/28]
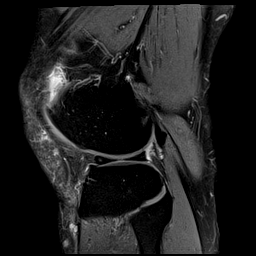
[im 10/28]
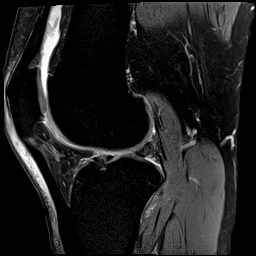
[im 14/28]
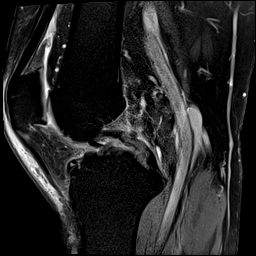
[im 19/28]
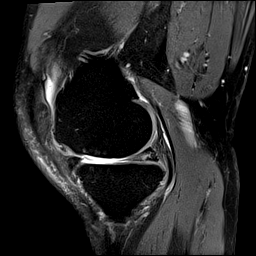
[im 23/28]
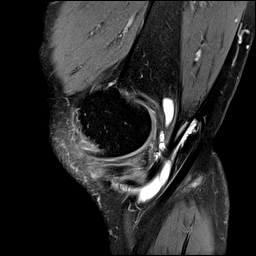
[im 28/28]
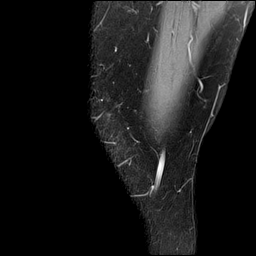

[Series 13: T2 fat-sat · sagittal · left · 3.0mm · 0.59mm/px · 7 of 28 slices shown (3 of 3)]
[im 1/28]
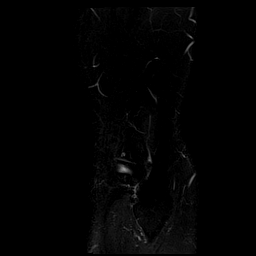
[im 5/28]
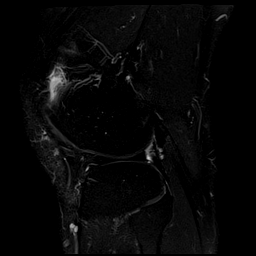
[im 10/28]
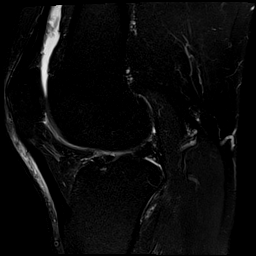
[im 14/28]
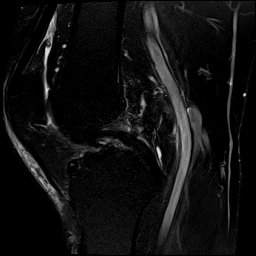
[im 19/28]
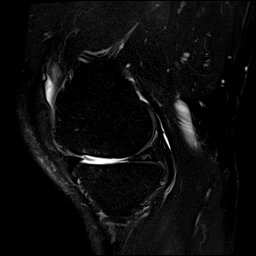
[im 23/28]
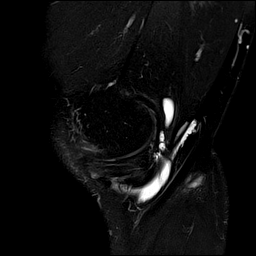
[im 28/28]
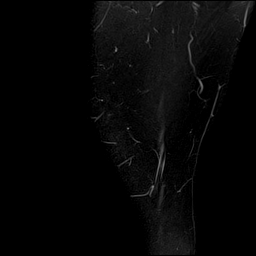

[Series 14: PD · coronal · left · 2.0mm · 0.47mm/px · 2 of 10 slices shown]
[im 1/10]
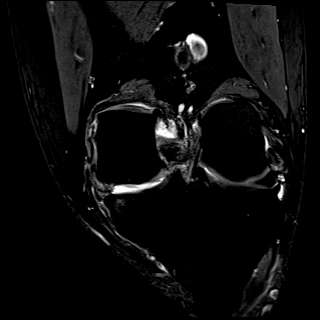
[im 10/10]
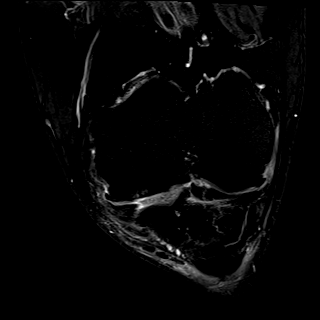

[40 of 40 positions shown; findings below may reference images not displayed]

FINDINGS: MENISCI

Medial meniscus: Complex degenerative tearing throughout the
posterior horn and body appears worse in the body.

Lateral meniscus:  Intact.

LIGAMENTS

Cruciates:  Intact.

Collaterals:  Intact.

CARTILAGE

Patellofemoral:  Mildly degenerated.

Medial:  Appears almost completely denuded.

Lateral:  Minimally degenerated.

Joint:  Small effusion.

Popliteal Fossa: There is fluid along the distal pes anserine
tendons compatible with bursitis. The patient also has a Baker's
cyst measuring approximately 5.1 cm craniocaudal by 2.1 cm
transverse by 0.6 cm AP.

Extensor Mechanism:  Intact.

Bones: No fracture, contusion or worrisome lesion. Osteophytosis
about the knee is noted. Mild subchondral edema is seen about the
medial compartment.

Other: None.
IMPRESSION: Osteoarthritis about the knee is asymmetrically worst in the medial
compartment where it is advanced.

Complex tearing posterior horn and body of the medial meniscus.

Findings compatible with pes anserine bursitis.

Small Baker's cyst.

## 2022-04-28 ENCOUNTER — Ambulatory Visit: Payer: BC Managed Care – PPO | Attending: Orthopedic Surgery

## 2022-04-28 ENCOUNTER — Other Ambulatory Visit: Payer: Self-pay

## 2022-04-28 DIAGNOSIS — M5459 Other low back pain: Secondary | ICD-10-CM | POA: Insufficient documentation

## 2022-04-28 DIAGNOSIS — M5416 Radiculopathy, lumbar region: Secondary | ICD-10-CM | POA: Insufficient documentation
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
## 2022-05-18 ENCOUNTER — Ambulatory Visit: Payer: BC Managed Care – PPO | Admitting: Orthopedic Surgery

## 2022-05-18 DIAGNOSIS — M810 Age-related osteoporosis without current pathological fracture: Secondary | ICD-10-CM | POA: Diagnosis not present

## 2022-05-18 DIAGNOSIS — M5416 Radiculopathy, lumbar region: Secondary | ICD-10-CM | POA: Diagnosis not present

## 2022-05-18 NOTE — Progress Notes (Signed)
Orthopedic Spine Surgery Office Note  Assessment: Patient is a 77 y.o. male with low back pain that radiates into the bilateral lower extremities along the posterior lateral aspect.  Suspect radiculopathy   Plan: -Patient has tried Tylenol, Advil, activity modification, Lyrica, PT -Patient is over the age of 68, so should be screened for osteoporosis.  DEXA scan ordered today -Patient has had no relief of his symptoms in spite of conservative treatments for over 6 weeks, so ordered MRI of the lumbar spine to evaluate for radiculopathy -Patient should return to office in 4 weeks, x-rays at next visit: None   Patient expressed understanding of the plan and all questions were answered to the patient's satisfaction.   ___________________________________________________________________________  History: Patient is a 77 y.o. male who has been previously seen in the office for symptoms consistent with lumbar radiculopathy.  Since last visit, patient has tried physical therapy again Lyrica in addition to the NSAIDs and Tylenol he was already seen.  He has not noticed any relief with these treatment modalities.  He is still having pain in his low back that radiates into his bilateral lower extremities.  He feels that radiates along the posterior lateral aspect of the thigh and leg.  He is more symptomatic on the left side.  Pain is worse if he is walking or standing for a prolonged period of time.  It gets better if he sits down or rest.  Denies paresthesia numbness.  Previous treatments: Tylenol, Advil, activity modification, Lyrica, PT  Physical Exam:  General: no acute distress, appears stated age Neurologic: alert, answering questions appropriately, following commands Respiratory: unlabored breathing on room air, symmetric chest rise Psychiatric: appropriate affect, normal cadence to speech   MSK (spine):   -Strength exam                                                   Left                   Right EHL                              4/5                  4/5 TA                                 5/5                  5/5 GSC                             5/5                  5/5 Knee extension            5/5                  5/5 Hip flexion                    5/5                  5/5   -Sensory exam  Sensation intact to light touch in L3-S1 nerve distributions of bilateral lower extremities   -Achilles DTR: 2/4 on the left, 2/4 on the right -Patellar tendon DTR: 2/4 on the left, 2/4 on the right   -Straight leg raise: Negative bilaterally -Femoral nerve stretch test: Negative bilaterally -Clonus: no beats bilaterally   -Left hip exam: No pain through range of motion, negative Stinchfield, negative FABER -Right hip exam: No pain through range of motion, negative Stinchfield, negative FABER  Imaging: XR of the lumbar spine from 03/22/2022 and 04/18/2022 was previously independently reviewed and interpreted, showing disc height loss at L4/5 and L5/S1.  There is spondylolisthesis at L5/S1 that shifts about 2 mm between flexion and extension.  No fracture or dislocation seen.  Lumbar scoliosis with apex to the left at L4.    Patient name: Tom [name] Patient MRN: [id number] Date of visit: 05/18/22

## 2022-05-30 ENCOUNTER — Ambulatory Visit: Payer: BC Managed Care – PPO | Admitting: Orthopedic Surgery

## 2022-05-30 ENCOUNTER — Other Ambulatory Visit: Payer: Self-pay | Admitting: Family Medicine

## 2022-05-30 DIAGNOSIS — E1169 Type 2 diabetes mellitus with other specified complication: Secondary | ICD-10-CM

## 2022-06-05 ENCOUNTER — Ambulatory Visit
Admission: RE | Admit: 2022-06-05 | Discharge: 2022-06-05 | Disposition: A | Discharge status: Home / Self Care | Payer: BC Managed Care – PPO | Source: Ambulatory Visit | Attending: Orthopedic Surgery | Admitting: Orthopedic Surgery

## 2022-06-05 DIAGNOSIS — M5416 Radiculopathy, lumbar region: Secondary | ICD-10-CM | POA: Diagnosis not present

## 2022-06-13 ENCOUNTER — Telehealth: Payer: Self-pay | Admitting: Orthopedic Surgery

## 2022-06-13 NOTE — Telephone Encounter (Signed)
Patient was supposed to get MRI and bone density scan prior to follow up on 05/16 pt has only gotten call for MRI and that is complete please advise about Bone density scan

## 2022-06-13 NOTE — Telephone Encounter (Signed)
Spoke to pt proxity and is aware can contact imaging to schedule appt

## 2022-06-15 ENCOUNTER — Telehealth: Payer: Self-pay | Admitting: Orthopedic Surgery

## 2022-06-15 NOTE — Telephone Encounter (Signed)
Patient advising that the facility has not received the order for the bone density test..please advise

## 2022-06-15 NOTE — Telephone Encounter (Signed)
Refaxed to [facility] at [phone]

## 2022-06-16 ENCOUNTER — Ambulatory Visit: Payer: BC Managed Care – PPO | Admitting: Orthopedic Surgery

## 2022-06-16 ENCOUNTER — Other Ambulatory Visit: Payer: Self-pay | Admitting: Family Medicine

## 2022-06-16 DIAGNOSIS — E1129 Type 2 diabetes mellitus with other diabetic kidney complication: Secondary | ICD-10-CM

## 2022-06-20 DIAGNOSIS — Z13820 Encounter for screening for osteoporosis: Secondary | ICD-10-CM | POA: Diagnosis not present

## 2022-06-20 DIAGNOSIS — M069 Rheumatoid arthritis, unspecified: Secondary | ICD-10-CM | POA: Diagnosis not present

## 2022-06-22 ENCOUNTER — Telehealth: Payer: Self-pay | Admitting: Orthopedic Surgery

## 2022-06-22 NOTE — Telephone Encounter (Signed)
Orthopedic Note  DEXA scan results obtained. T score of -0.3. No osteoporosis or osteopenia. No treatment for bone density needed at this time.   London Sheer, MD Orthopedic Surgeon

## 2022-06-23 ENCOUNTER — Ambulatory Visit: Payer: BC Managed Care – PPO | Admitting: Orthopedic Surgery

## 2022-06-23 DIAGNOSIS — M5416 Radiculopathy, lumbar region: Secondary | ICD-10-CM

## 2022-06-23 MED ORDER — TRAMADOL HCL 50 MG PO TABS: 50.0000 mg | ORAL_TABLET | Freq: Four times a day (QID) | ORAL | 0 refills | Status: AC | PRN

## 2022-06-23 NOTE — Progress Notes (Signed)
Orthopedic Spine Surgery Office Note   Assessment: Patient is a 77 y.o. male with low back pain that radiates into the bilateral lower extremities along the posterior lateral aspect. Has bilateral foraminal stenosis at L4/5 and L5/S1. Also has lateral recess stenosis at L4/5.      Plan: -Patient has tried Tylenol, Advil, activity modification, Lyrica, PT -Discussed surgery as a treatment option for him. Recommended indirect decompression with L4/5 and L5/S1 ALIF and and L4-S1 PSIF. After covering the surgical option, patient wanted to proceed -Patient will next be seen at date of surgery     The patient has symptoms consistent with lumbar radiculopathy. The patient's symptoms were not getting improvement with conservative treatment so operative management was discussed in the form of L4-S1 ALIF and PSIF. The risks including but not limited to dural tear, pseudarthosis, nerve root injury, paralysis, persistent pain, infection, bleeding, hardware failure, adjacent segment disease, vascular injury, bowel injury, retrograde ejaculation, heart attack, death, stroke, fracture, and need for additional procedures were discussed with the patient. Since this will be an indirect decompression, there is a risk of persistent symptoms which the patient understood after explaining the reason. The benefit of the surgery would be improvement in the patient's radiating leg pain. I explained that back pain relief is not the goal of the surgery and it is not reliably alleviated with this surgery.The alternatives to surgical management were covered with the patient and included continued monitoring, physical therapy, over-the-counter pain medications, ambulatory aids, steroid injections, and activity modification. All the patient's questions were answered to his satisfaction. After this discussion, the patient expressed understanding and elected to proceed with surgical intervention.       ___________________________________________________________________________   History: Patient is a 77 y.o. male who has been previously seen in the office for symptoms consistent with lumbar radiculopathy. He has had not had any change in his symptoms since the last time he was seen. He is still having pain that starts in his low back and radiates into his bilateral lower extremities. He feels it along the posterior aspect of the legs. It is more significant on the left side. He feels the pain on a daily basis. He feels the pain at rest and with activity. The pain has gotten so severe that he at times cannot work due to the pain.     Previous treatments: Tylenol, Advil, activity modification, Lyrica, PT   Physical Exam:   General: no acute distress, appears stated age Neurologic: alert, answering questions appropriately, following commands Respiratory: unlabored breathing on room air, symmetric chest rise Psychiatric: appropriate affect, normal cadence to speech     MSK (spine):   -Strength exam                                                   Left                  Right EHL                              4/5                  4/5 TA  5/5                  5/5 GSC                             5/5                  5/5 Knee extension            5/5                  5/5 Hip flexion                    5/5                  5/5   -Sensory exam                           Sensation intact to light touch in L3-S1 nerve distributions of bilateral lower extremities   -Achilles DTR: 2/4 on the left, 2/4 on the right -Patellar tendon DTR: 2/4 on the left, 2/4 on the right   -Straight leg raise: negative bilaterally -Femoral nerve stretch test: negative bilaterally -Clonus: no beats bilaterally   -Left hip exam: No pain through range of motion, negative Stinchfield, negative FABER -Right hip exam: No pain through range of motion, negative Stinchfield, negative  FABER   Imaging: XR of the lumbar spine from 03/22/2022 and 04/18/2022 was previously independently reviewed and interpreted, showing disc height loss at L4/5 and L5/S1.  There is spondylolisthesis at L5/S1 that shifts about 2 mm between flexion and extension.  No fracture or dislocation seen.  Lumbar scoliosis with apex to the left at L4.    MRI of the lumbar spine from 06/05/2022 was independently reviewed and interpreted, showing bilateral foraminal stenosis at L4/5 and L5/S1. Lateral recess stenosis at L4/5. There is a spondylolisthesis at L5/S1. DDD at L4/5 and L5/S1.    Patient name: Tom [name] Patient MRN: [id number] Date of visit:06/23/22

## 2022-06-23 NOTE — Progress Notes (Signed)
Pre-operative Scores  ODI: 18 VAS back: 8 VAS leg: 8 SF-36:  -Physical functioning: 35  -Role limitations due to physical health: 0  -Role limitations due to emotional problems: 100  -Energy/fatigue: 70  -Emotional well-being: 100  -Social functioning: 87.5  -Pain: 22.5  -General health: 25   London Sheer, MD Orthopedic Surgeon

## 2022-06-28 ENCOUNTER — Telehealth: Payer: Self-pay

## 2022-06-28 NOTE — Telephone Encounter (Signed)
Pt aware to be here tomorrow at 9:40. CPE scheduled for Dec next available.

## 2022-06-28 NOTE — Telephone Encounter (Addendum)
Pt was supposed to be scheduled as a preop clearance and is in a 15 min slot  Please call pt to tell him to be here early or we won't be able to do the clearance, if he is unable to come early please r/s as a 30 min apt   and while on the phone with him, if we can have him schedule his physical, which was not scheduled when we saw him in November    Thank you!

## 2022-06-28 NOTE — Progress Notes (Signed)
Subjective: CC:DM but actually needs preop clearance PCP: Raliegh Ip, DO [id number] Tom [name] is a 77 y.o. male presenting to clinic today for:  [employer] video interpreter [place], [place] [id number] used for Spanish translation of this visit   1. Type 2 Diabetes with hypertension, hyperlipidemia:  Compliant with lipitor and metformin.  Needs refills.  No hypoglycemic symptoms. Denies CP, SOB, edema.  Last eye exam: needs Last foot exam: UTD Last A1c:  Lab Results  Component Value Date   HGBA1C 7.0 (H) 12/01/2021   Nephropathy screen indicated?: needs Last flu, zoster and/or pneumovax:  Immunization History  Administered Date(s) Administered   Fluad Quad(high Dose 65+) 12/01/2021   Hepatitis B 06/18/2017   Hepatitis B, ADULT 07/21/2017   Influenza Inj Mdck Quad With Preservative 11/01/2018   Influenza Split 10/31/2017   Influenza, High Dose Seasonal PF 01/02/2018   Influenza,inj,Quad PF,6+ Mos 11/06/2014, 11/03/2015, 11/23/2016   Influenza-Unspecified 12/31/2013, 01/02/2018   Moderna Sars-Covid-2 Vaccination 03/28/2019, 04/26/2019   Pneumococcal Conjugate-13 06/18/2017   Pneumococcal Polysaccharide-23 08/17/2018   Td 09/05/1994, 02/04/1996, 11/13/2012   Td (Adult), 2 Lf Tetanus Toxid, Preservative Free 09/05/1994, 11/13/2012   Tdap 02/04/1996, 11/13/2012    2. Pt is a 77 y.o. male who is here for preoperative clearance for ALIF, PSIF with Dr Christell Constant. Not yet scheduled. Denies history of problems with sedation previously. Has h/o abdominal surgery.  No difficulty with extending neck.  1) High Risk Cardiac Conditions  1) Recent MI - No.  2) Decompensated Heart Failure - No.  3) Unstable angina - No.  4) Symptomatic arrythmia - No.  5) Sx Valvular Disease - No.  2) Intermediate Risk Factors - DM - Yes.    2) Functional Status - > 4 mets (but has knee and back pain with activity) Yes.  Marland Kitchen   3) Surgery Specific Risk - Intermediate      4) Further Noninvasive evaluation  -   1) EKG - Yes.     1) Hx of CVA, CAD, DM, CKD  2) Echo - No.   1) Worsening dyspnea   3) Stress Testing - Active Cardiac Disease - No.  5) Need for medical therapy - Beta Blocker, Statins indicated ? No.  ROS: Per HPI  No Known Allergies Past Medical History:  Diagnosis Date   Diabetes mellitus without complication (HCC)    Dyslipidemia    Hemorrhoids     Current Outpatient Medications:    diclofenac Sodium (VOLTAREN) 1 % GEL, Apply 2 g topically 4 (four) times daily., Disp: 150 g, Rfl: PRN   glucosamine-chondroitin 500-400 MG tablet, Take 1 tablet by mouth 3 (three) times daily., Disp: , Rfl:    omega-3 acid ethyl esters (LOVAZA) 1 g capsule, Take by mouth 2 (two) times daily., Disp: , Rfl:    atorvastatin (LIPITOR) 20 MG tablet, Take 1 tablet (20 mg total) by mouth daily., Disp: 100 tablet, Rfl: 3   metFORMIN (GLUCOPHAGE-XR) 500 MG 24 hr tablet, Take 1 tablet (500 mg total) by mouth daily with breakfast., Disp: 100 tablet, Rfl: 3   pregabalin (LYRICA) 50 MG capsule, Take 1 capsule (50 mg total) by mouth 3 (three) times daily., Disp: 90 capsule, Rfl: 1 Social History   Socioeconomic History   Marital status: Single    Spouse name: Not on file   Number of children: Not on file   Years of education: Not on file   Highest education level: Not on file  Occupational History   Not on file  Tobacco Use   Smoking status: Former    Passive exposure: Past   Smokeless tobacco: Never  Vaping Use   Vaping Use: Never used  Substance and Sexual Activity   Alcohol use: No   Drug use: No   Sexual activity: Not on file  Other Topics Concern   Not on file  Social History Narrative   Still works.   Lives with friends.     Social Determinants of Health   Financial Resource Strain: Not on file  Food Insecurity: Not on file  Transportation Needs: Not on file  Physical Activity: Not on file  Stress: Not on file  Social Connections: Not on file  Intimate Partner Violence: Not on  file   Family History  Problem Relation Age of Onset   Cancer Mother    Heart Problems Father    Stomach cancer Brother     Objective: Office vital signs reviewed. BP 117/71   Pulse 69   Temp 98.7 F (37.1 C)   Ht 5\' 3"  (1.6 m)   Wt 187 lb (84.8 kg)   SpO2 96%   BMI 33.13 kg/m   Physical Examination:  General: Awake, alert, well nourished, No acute distress HEENT: Mallampati 3, MMM Cardio: regular rate and rhythm, S1S2 heard, no murmurs appreciated Pulm: clear to auscultation bilaterally, no wheezes, rhonchi or rales; normal work of breathing on room air Extremities: warm, well perfused, No edema, cyanosis or clubbing; +2 pulses bilaterally MSK: antalgic but independent gait and station.  Assessment/ Plan: 77 y.o. male   Preop examination - Plan: EKG 12-Lead, CMP14+EGFR, CBC, CoaguChek XS/INR Waived, CoaguChek XS/INR Waived  Type 2 diabetes mellitus with microalbuminuria, without long-term current use of insulin (HCC) - Plan: EKG 12-Lead, CMP14+EGFR, CBC, Bayer DCA Hb A1c Waived, Microalbumin / creatinine urine ratio, metFORMIN (GLUCOPHAGE-XR) 500 MG 24 hr tablet  Hyperlipidemia associated with type 2 diabetes mellitus (HCC) - Plan: EKG 12-Lead, atorvastatin (LIPITOR) 20 MG tablet  Uses Spanish as primary spoken language  I have independently evaluated patient.  Eleazar Zahorik is a 76 y.o. male who is los risk for a intermediate risk surgery.  There are not modifiable risk factors (smoking, etc). Ned Yardley's RCRI calculation for MACE is: 0.    Orders Placed This Encounter  Procedures   CMP14+EGFR   CBC   CoaguChek XS/INR Waived   Bayer DCA Hb A1c Waived   Microalbumin / creatinine urine ratio   CoaguChek XS/INR Waived   EKG 12-Lead   Meds ordered this encounter  Medications   atorvastatin (LIPITOR) 20 MG tablet    Sig: Take 1 tablet (20 mg total) by mouth daily.    Dispense:  100 tablet    Refill:  3   metFORMIN (GLUCOPHAGE-XR) 500 MG 24 hr tablet     Sig: Take 1 tablet (500 mg total) by mouth daily with breakfast.    Dispense:  100 tablet    Refill:  3     Juddson Cobern Hulen Skains, DO [facility] [phone]

## 2022-06-29 ENCOUNTER — Encounter: Payer: Self-pay | Admitting: Family Medicine

## 2022-06-29 ENCOUNTER — Other Ambulatory Visit: Payer: Self-pay | Admitting: Family Medicine

## 2022-06-29 ENCOUNTER — Ambulatory Visit (INDEPENDENT_AMBULATORY_CARE_PROVIDER_SITE_OTHER): Payer: BC Managed Care – PPO | Admitting: Family Medicine

## 2022-06-29 VITALS — BP 117/71 | HR 69 | Temp 98.7°F | Ht 63.0 in | Wt 187.0 lb

## 2022-06-29 DIAGNOSIS — E1169 Type 2 diabetes mellitus with other specified complication: Secondary | ICD-10-CM | POA: Diagnosis not present

## 2022-06-29 DIAGNOSIS — Z01818 Encounter for other preprocedural examination: Secondary | ICD-10-CM | POA: Diagnosis not present

## 2022-06-29 DIAGNOSIS — Z789 Other specified health status: Secondary | ICD-10-CM

## 2022-06-29 DIAGNOSIS — R809 Proteinuria, unspecified: Secondary | ICD-10-CM | POA: Diagnosis not present

## 2022-06-29 DIAGNOSIS — E1129 Type 2 diabetes mellitus with other diabetic kidney complication: Secondary | ICD-10-CM | POA: Diagnosis not present

## 2022-06-29 DIAGNOSIS — E785 Hyperlipidemia, unspecified: Secondary | ICD-10-CM

## 2022-06-29 DIAGNOSIS — Z7984 Long term (current) use of oral hypoglycemic drugs: Secondary | ICD-10-CM | POA: Diagnosis not present

## 2022-06-29 LAB — CMP14+EGFR
ALT: 26 IU/L (ref 0–44)
AST: 23 IU/L (ref 0–40)
Albumin/Globulin Ratio: 1.6 (ref 1.2–2.2)
Albumin: 4.4 g/dL (ref 3.8–4.8)
Alkaline Phosphatase: 83 IU/L (ref 44–121)
BUN/Creatinine Ratio: 18 (ref 10–24)
BUN: 16 mg/dL (ref 8–27)
Bilirubin Total: 0.4 mg/dL (ref 0.0–1.2)
CO2: 23 mmol/L (ref 20–29)
Calcium: 9.8 mg/dL (ref 8.6–10.2)
Chloride: 102 mmol/L (ref 96–106)
Creatinine, Ser: 0.91 mg/dL (ref 0.76–1.27)
Globulin, Total: 2.7 g/dL (ref 1.5–4.5)
Glucose: 122 mg/dL — ABNORMAL HIGH (ref 70–99)
Potassium: 4.9 mmol/L (ref 3.5–5.2)
Sodium: 140 mmol/L (ref 134–144)
Total Protein: 7.1 g/dL (ref 6.0–8.5)
eGFR: 88 mL/min/{1.73_m2} (ref >59)

## 2022-06-29 LAB — CBC
Hematocrit: 45.4 % (ref 37.5–51.0)
Hemoglobin: 14.6 g/dL (ref 13.0–17.7)
MCH: 27.8 pg (ref 26.6–33.0)
MCHC: 32.2 g/dL (ref 31.5–35.7)
MCV: 86 fL (ref 79–97)
Platelets: 240 10*3/uL (ref 150–450)
RBC: 5.26 x10E6/uL (ref 4.14–5.80)
RDW: 14.5 % (ref 11.6–15.4)
WBC: 7.3 10*3/uL (ref 3.4–10.8)

## 2022-06-29 LAB — BAYER DCA HB A1C WAIVED: HB A1C (BAYER DCA - WAIVED): 6.9 % — ABNORMAL HIGH (ref 4.8–5.6)

## 2022-06-29 LAB — COAGUCHEK XS/INR WAIVED
INR: 1.1 (ref 0.9–1.1)
Prothrombin Time: 12.6 s

## 2022-06-29 MED ORDER — ATORVASTATIN CALCIUM 20 MG PO TABS: 20.0000 mg | ORAL_TABLET | Freq: Every day | ORAL | 3 refills | Status: DC

## 2022-06-29 MED ORDER — METFORMIN HCL ER 500 MG PO TB24: 500.0000 mg | ORAL_TABLET | Freq: Every day | ORAL | 3 refills | Status: DC

## 2022-06-29 NOTE — Patient Instructions (Signed)
NO Diclofenac before surgery.  Hold for at least 1 week before surgery. Will call with other labs Friday You are cleared for surgery

## 2022-07-01 LAB — MICROALBUMIN / CREATININE URINE RATIO
Creatinine, Urine: 127.9 mg/dL
Microalb/Creat Ratio: 86 mg/g creat — ABNORMAL HIGH (ref 0–29)
Microalbumin, Urine: 110 ug/mL

## 2022-07-26 DIAGNOSIS — H35363 Drusen (degenerative) of macula, bilateral: Secondary | ICD-10-CM | POA: Diagnosis not present

## 2022-08-15 DIAGNOSIS — H26491 Other secondary cataract, right eye: Secondary | ICD-10-CM | POA: Diagnosis not present

## 2022-08-26 DIAGNOSIS — H60391 Other infective otitis externa, right ear: Secondary | ICD-10-CM | POA: Diagnosis not present

## 2022-09-05 DIAGNOSIS — H26492 Other secondary cataract, left eye: Secondary | ICD-10-CM | POA: Diagnosis not present

## 2022-11-21 DIAGNOSIS — J069 Acute upper respiratory infection, unspecified: Secondary | ICD-10-CM | POA: Diagnosis not present

## 2023-01-23 ENCOUNTER — Ambulatory Visit (INDEPENDENT_AMBULATORY_CARE_PROVIDER_SITE_OTHER): Payer: BC Managed Care – PPO

## 2023-01-23 ENCOUNTER — Encounter: Payer: Self-pay | Admitting: Family Medicine

## 2023-01-23 ENCOUNTER — Ambulatory Visit: Payer: BC Managed Care – PPO

## 2023-01-23 ENCOUNTER — Ambulatory Visit: Payer: BC Managed Care – PPO | Admitting: Family Medicine

## 2023-01-23 VITALS — BP 120/74 | HR 66 | Temp 98.7°F | Ht 63.0 in | Wt 184.0 lb

## 2023-01-23 DIAGNOSIS — W19XXXA Unspecified fall, initial encounter: Secondary | ICD-10-CM

## 2023-01-23 DIAGNOSIS — R0781 Pleurodynia: Secondary | ICD-10-CM | POA: Diagnosis not present

## 2023-01-23 DIAGNOSIS — Y92009 Unspecified place in unspecified non-institutional (private) residence as the place of occurrence of the external cause: Secondary | ICD-10-CM | POA: Diagnosis not present

## 2023-01-23 MED ORDER — HYDROCODONE-ACETAMINOPHEN 5-325 MG PO TABS
1.0000 | ORAL_TABLET | Freq: Four times a day (QID) | ORAL | 0 refills | Status: DC | PRN
Start: 2023-01-23 — End: 2023-06-07

## 2023-01-23 MED ORDER — DOCUSATE SODIUM 100 MG PO CAPS: 100.0000 mg | ORAL_CAPSULE | Freq: Two times a day (BID) | ORAL | 99 refills | Status: DC | PRN

## 2023-01-23 NOTE — Progress Notes (Signed)
Subjective: NW:GNFA PCP: Raliegh Ip, DO [id number] Tom [name] is a 77 y.o. male presenting to clinic today for:  [employer] video interpreter Tom [name], [place] [id number] used for Spanish translation of this visit  1.  Fall Patient reports that he sustained a mechanical fall while digging a trench last Friday.  He has had worsening right sided rib pain that is worse with taking deep breaths in or coughing.  He used several Tylenol and did not find sufficient pain relief.  He denies any hemoptysis, shortness of breath.  Pain is worse with certain movements.   ROS: Per HPI  No Known Allergies Past Medical History:  Diagnosis Date   Diabetes mellitus without complication (HCC)    Dyslipidemia    Hemorrhoids     Current Outpatient Medications:    atorvastatin (LIPITOR) 20 MG tablet, Take 1 tablet (20 mg total) by mouth daily., Disp: 100 tablet, Rfl: 3   diclofenac Sodium (VOLTAREN) 1 % GEL, Apply 2 g topically 4 (four) times daily., Disp: 150 g, Rfl: PRN   glucosamine-chondroitin 500-400 MG tablet, Take 1 tablet by mouth 3 (three) times daily., Disp: , Rfl:    metFORMIN (GLUCOPHAGE-XR) 500 MG 24 hr tablet, Take 1 tablet (500 mg total) by mouth daily with breakfast., Disp: 100 tablet, Rfl: 3   omega-3 acid ethyl esters (LOVAZA) 1 g capsule, Take by mouth 2 (two) times daily., Disp: , Rfl:    pregabalin (LYRICA) 50 MG capsule, Take 1 capsule (50 mg total) by mouth 3 (three) times daily., Disp: 90 capsule, Rfl: 1 Social History   Socioeconomic History   Marital status: Single    Spouse name: Not on file   Number of children: Not on file   Years of education: Not on file   Highest education level: Not on file  Occupational History   Not on file  Tobacco Use   Smoking status: Former    Passive exposure: Past   Smokeless tobacco: Never  Vaping Use   Vaping status: Never Used  Substance and Sexual Activity   Alcohol use: No   Drug use: No   Sexual activity: Not on file  Other  Topics Concern   Not on file  Social History Narrative   Still works.   Lives with friends.     Social Drivers of Corporate investment banker Strain: Not on file  Food Insecurity: Not on file  Transportation Needs: Not on file  Physical Activity: Not on file  Stress: Not on file  Social Connections: Not on file  Intimate Partner Violence: Not on file   Family History  Problem Relation Age of Onset   Cancer Mother    Heart Problems Father    Stomach cancer Brother     Objective: Office vital signs reviewed. BP 120/74   Pulse 66   Temp 98.7 F (37.1 C)   Ht 5\' 3"  (1.6 m)   Wt 184 lb (83.5 kg)   SpO2 97%   BMI 32.59 kg/m   Physical Examination:  General: Awake, alert, well nourished, No acute distress HEENT: sclera white, MMM Cardio: regular rate and rhythm, S1S2 heard, no murmurs appreciated Pulm: clear to auscultation bilaterally, no wheezes, rhonchi or rales; normal work of breathing on room air Ribs: Exquisitely tender to palpation with associated soft tissue swelling but no ecchymosis or palpable crepitus along the right ribs at approximately rib 7 through 9 laterally.  Assessment/ Plan: 77 y.o. male   Rib pain on right side -  Plan: DG Ribs Unilateral W/Chest Right, HYDROcodone-acetaminophen (NORCO) 5-325 MG tablet, docusate sodium (COLACE) 100 MG capsule  Fall at home, initial encounter - Plan: DG Ribs Unilateral W/Chest Right  I personally reviewed plain films and could not appreciate a fracture but awaiting review by radiologist.  Suspect contusion.  He had quite a bit of soft tissue swelling on palpation today.  Encouraged use of ice, heat, oral NSAID.  I have given him a small amount of hydrocodone to have 1 for severe pain and encouraged use Colace with each dose.  Work note provided excusing for the next 10 business days.  He will follow-up as needed   Raliegh Ip, DO [facility] [phone]

## 2023-01-30 ENCOUNTER — Ambulatory Visit: Payer: BC Managed Care – PPO | Admitting: Family Medicine

## 2023-01-30 ENCOUNTER — Other Ambulatory Visit: Payer: Self-pay | Admitting: Family Medicine

## 2023-01-30 ENCOUNTER — Encounter: Payer: Self-pay | Admitting: Family Medicine

## 2023-01-30 ENCOUNTER — Ambulatory Visit: Payer: BC Managed Care – PPO

## 2023-01-30 VITALS — BP 129/69 | HR 69 | Temp 97.9°F | Ht 63.0 in | Wt 184.0 lb

## 2023-01-30 DIAGNOSIS — E1169 Type 2 diabetes mellitus with other specified complication: Secondary | ICD-10-CM | POA: Insufficient documentation

## 2023-01-30 DIAGNOSIS — R0781 Pleurodynia: Secondary | ICD-10-CM

## 2023-01-30 DIAGNOSIS — Z125 Encounter for screening for malignant neoplasm of prostate: Secondary | ICD-10-CM

## 2023-01-30 DIAGNOSIS — Z7984 Long term (current) use of oral hypoglycemic drugs: Secondary | ICD-10-CM | POA: Diagnosis not present

## 2023-01-30 DIAGNOSIS — M25562 Pain in left knee: Secondary | ICD-10-CM

## 2023-01-30 DIAGNOSIS — H6992 Unspecified Eustachian tube disorder, left ear: Secondary | ICD-10-CM

## 2023-01-30 DIAGNOSIS — M25561 Pain in right knee: Secondary | ICD-10-CM

## 2023-01-30 DIAGNOSIS — L309 Dermatitis, unspecified: Secondary | ICD-10-CM

## 2023-01-30 DIAGNOSIS — Z0000 Encounter for general adult medical examination without abnormal findings: Secondary | ICD-10-CM

## 2023-01-30 DIAGNOSIS — E119 Type 2 diabetes mellitus without complications: Secondary | ICD-10-CM | POA: Diagnosis not present

## 2023-01-30 DIAGNOSIS — E785 Hyperlipidemia, unspecified: Secondary | ICD-10-CM | POA: Diagnosis not present

## 2023-01-30 DIAGNOSIS — Z0001 Encounter for general adult medical examination with abnormal findings: Secondary | ICD-10-CM

## 2023-01-30 DIAGNOSIS — G8929 Other chronic pain: Secondary | ICD-10-CM

## 2023-01-30 DIAGNOSIS — Z1211 Encounter for screening for malignant neoplasm of colon: Secondary | ICD-10-CM

## 2023-01-30 DIAGNOSIS — Z130 Encounter for screening for diseases of the blood and blood-forming organs and certain disorders involving the immune mechanism: Secondary | ICD-10-CM

## 2023-01-30 LAB — BAYER DCA HB A1C WAIVED: HB A1C (BAYER DCA - WAIVED): 7.1 % — ABNORMAL HIGH (ref 4.8–5.6)

## 2023-01-30 MED ORDER — LORATADINE 10 MG PO TABS: 10.0000 mg | ORAL_TABLET | Freq: Every day | ORAL | 3 refills | Status: DC

## 2023-01-30 MED ORDER — DICLOFENAC SODIUM 1 % EX GEL: 2.0000 g | Freq: Four times a day (QID) | CUTANEOUS | 99 refills | Status: AC

## 2023-01-30 MED ORDER — TRIAMCINOLONE ACETONIDE 0.1 % EX CREA: 1.0000 | TOPICAL_CREAM | Freq: Two times a day (BID) | CUTANEOUS | 1 refills | Status: DC

## 2023-01-30 MED ORDER — METFORMIN HCL ER 500 MG PO TB24: 500.0000 mg | ORAL_TABLET | Freq: Every day | ORAL | 3 refills | Status: DC

## 2023-01-30 MED ORDER — DOCUSATE SODIUM 100 MG PO CAPS: 100.0000 mg | ORAL_CAPSULE | Freq: Two times a day (BID) | ORAL | 99 refills | Status: DC | PRN

## 2023-01-30 MED ORDER — METFORMIN HCL ER 500 MG PO TB24: 500.0000 mg | ORAL_TABLET | Freq: Two times a day (BID) | ORAL | 3 refills | Status: DC

## 2023-01-30 MED ORDER — ATORVASTATIN CALCIUM 20 MG PO TABS: 20.0000 mg | ORAL_TABLET | Freq: Every day | ORAL | 3 refills | Status: DC

## 2023-01-30 NOTE — Progress Notes (Signed)
Unable to complete eye exam due to patient's eyes not dilating

## 2023-01-30 NOTE — Patient Instructions (Signed)
Aplicar una buena crema hidratante TODOS los das despus del bao.  Utilice la crema prescrita en las zonas oscuras que pican durante no ms de 2 semanas seguidas (triamcinolona).   Se ha enviado una pastilla para la picazn para usar diariamente (Claritin).   Puede usar Research scientist (medical) para el estreimiento [employer] al da si es necesario (Colace)
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
## 2023-01-31 LAB — CBC
Hematocrit: 44.8 % (ref 37.5–51.0)
Hemoglobin: 14.6 g/dL (ref 13.0–17.7)
MCH: 28.5 pg (ref 26.6–33.0)
MCHC: 32.6 g/dL (ref 31.5–35.7)
MCV: 87 fL (ref 79–97)
Platelets: 278 10*3/uL (ref 150–450)
RBC: 5.13 x10E6/uL (ref 4.14–5.80)
RDW: 13.8 % (ref 11.6–15.4)
WBC: 7.7 10*3/uL (ref 3.4–10.8)

## 2023-01-31 LAB — CMP14+EGFR
ALT: 22 [IU]/L (ref 0–44)
AST: 19 [IU]/L (ref 0–40)
Albumin: 4.5 g/dL (ref 3.8–4.8)
Alkaline Phosphatase: 96 [IU]/L (ref 44–121)
BUN/Creatinine Ratio: 15 (ref 10–24)
BUN: 13 mg/dL (ref 8–27)
Bilirubin Total: 0.3 mg/dL (ref 0.0–1.2)
CO2: 23 mmol/L (ref 20–29)
Calcium: 9.5 mg/dL (ref 8.6–10.2)
Chloride: 102 mmol/L (ref 96–106)
Creatinine, Ser: 0.88 mg/dL (ref 0.76–1.27)
Globulin, Total: 2.7 g/dL (ref 1.5–4.5)
Glucose: 126 mg/dL — ABNORMAL HIGH (ref 70–99)
Potassium: 4.5 mmol/L (ref 3.5–5.2)
Sodium: 141 mmol/L (ref 134–144)
Total Protein: 7.2 g/dL (ref 6.0–8.5)
eGFR: 90 mL/min/{1.73_m2} (ref >59)

## 2023-01-31 LAB — LIPID PANEL
Chol/HDL Ratio: 4.3 {ratio} (ref 0.0–5.0)
Cholesterol, Total: 138 mg/dL (ref 100–199)
HDL: 32 mg/dL — ABNORMAL LOW (ref >39)
LDL Chol Calc (NIH): 72 mg/dL (ref 0–99)
Triglycerides: 201 mg/dL — ABNORMAL HIGH (ref 0–149)
VLDL Cholesterol Cal: 34 mg/dL (ref 5–40)

## 2023-01-31 LAB — PSA: Prostate Specific Ag, Serum: 1.7 ng/mL (ref 0.0–4.0)

## 2023-03-26 ENCOUNTER — Other Ambulatory Visit: Payer: Self-pay | Admitting: Family Medicine

## 2023-03-26 DIAGNOSIS — L309 Dermatitis, unspecified: Secondary | ICD-10-CM

## 2023-05-19 ENCOUNTER — Ambulatory Visit: Payer: Self-pay

## 2023-05-19 NOTE — Telephone Encounter (Signed)
 Chief Complaint: Numbness Symptoms: HA,  Frequency: x 4 days Pertinent Negatives: Patient denies CP, SOB, dizziness, changes in speech Disposition: [] ED /[x] Urgent Care (no appt availability in office) / [] Appointment(In office/virtual)/ []  [facility]/ [] Home Care/ [] Refused Recommended Disposition /[] [facility]/ []  Follow-up with PCP Additional Notes: Pt reports he began with left LBP and numbness from hip to foot about 4 days ago. Pt notes he experienced this in early January and it resolved on its own. OV unavailable, advised UC, pt agreeable. This RN educated pt on home care, new-worsening symptoms, when to call back/seek emergent care. Pt verbalized understanding and agrees to plan.    Copied from CRM [id number]. Topic: Clinical - Red Word Triage >> May 19, 2023 12:26 PM Delon DASEN wrote: Red Word that prompted transfer to Nurse Triage: numbness from hip down left leg, also feels tight Reason for Disposition  Back pain (and neurologic deficit)  Answer Assessment - Initial Assessment Questions 1. SYMPTOM: What is the main symptom you are concerned about? (e.g., weakness, numbness)     Numbness left leg from hip to foot 2. ONSET: When did this start? (minutes, hours, days; while sleeping)     X 4 days 3. LAST NORMAL: When was the last time you (the patient) were normal (no symptoms)?     X 5 days 4. PATTERN Does this come and go, or has it been constant since it started?  Is it present now?     Constant, intermittently worsening 5. CARDIAC SYMPTOMS: Have you had any of the following symptoms: chest pain, difficulty breathing, palpitations?     None 6. NEUROLOGIC SYMPTOMS: Have you had any of the following symptoms: headache, dizziness, vision loss, double vision, changes in speech, unsteady on your feet?     HA, in AM feels unsteady upon waking, blurry vision upon waking 7. OTHER SYMPTOMS: Do you have any other symptoms?     None  Protocols  used: Neurologic Deficit-A-AH

## 2023-06-06 ENCOUNTER — Telehealth: Payer: Self-pay | Admitting: Family Medicine

## 2023-06-06 NOTE — Telephone Encounter (Signed)
 Letter printed and placed up front.  Aware

## 2023-06-07 ENCOUNTER — Ambulatory Visit (INDEPENDENT_AMBULATORY_CARE_PROVIDER_SITE_OTHER)

## 2023-06-07 ENCOUNTER — Telehealth: Payer: Self-pay | Admitting: Family Medicine

## 2023-06-07 ENCOUNTER — Encounter: Payer: Self-pay | Admitting: Family Medicine

## 2023-06-07 ENCOUNTER — Ambulatory Visit

## 2023-06-07 VITALS — BP 127/73 | HR 90 | Ht 63.0 in | Wt 186.0 lb

## 2023-06-07 DIAGNOSIS — M545 Low back pain, unspecified: Secondary | ICD-10-CM | POA: Diagnosis not present

## 2023-06-07 DIAGNOSIS — M5442 Lumbago with sciatica, left side: Secondary | ICD-10-CM

## 2023-06-07 DIAGNOSIS — M5441 Lumbago with sciatica, right side: Secondary | ICD-10-CM

## 2023-06-07 DIAGNOSIS — M47816 Spondylosis without myelopathy or radiculopathy, lumbar region: Secondary | ICD-10-CM | POA: Diagnosis not present

## 2023-06-07 DIAGNOSIS — M4318 Spondylolisthesis, sacral and sacrococcygeal region: Secondary | ICD-10-CM | POA: Diagnosis not present

## 2023-06-07 MED ORDER — KETOROLAC TROMETHAMINE 60 MG/2ML IM SOLN
60.0000 mg | Freq: Once | INTRAMUSCULAR | Status: AC
  Administered 2023-06-07: 60 mg via INTRAMUSCULAR

## 2023-06-07 MED ORDER — NAPROXEN 500 MG PO TABS: 500.0000 mg | ORAL_TABLET | Freq: Two times a day (BID) | ORAL | 1 refills | Status: AC

## 2023-06-07 NOTE — Progress Notes (Signed)
 BP 127/73   Pulse 90   Ht 5\' 3"  (1.6 m)   Wt 186 lb (84.4 kg)   SpO2 93%   BMI 32.95 kg/m    Subjective:   Patient ID: Tom [name], male    DOB: [birth year], 78 y.o.   MRN: [id number]  HPI: Tom [name] is a 78 y.o. male presenting on 06/07/2023 for Back Pain (Radiates down both legs)   HPI Low back pain Patient comes in today complaining of low back pain that has been bothering him for quite a few months and is just not getting better.  It hurts down in his lower back on both sides and then goes down both of his legs all the way down.  He says that this has been not getting better despite using some Tylenol .  He says he has a extensive history of working on his feet.  He says the back hurts him a lot more when he is on his feet but does also hurt when he is getting up and down from a sitting to laying position to standing position.  It does hurt around the front of the lower back/hip on the left side sometimes as well but the worst of it is on both lower back areas extending down his legs.  He has used some Voltaren  gel for this as well.  Relevant past medical, surgical, family and social history reviewed and updated as indicated. Interim medical history since our last visit reviewed. Allergies and medications reviewed and updated.  Review of Systems  Constitutional:  Negative for chills and fever.  Respiratory:  Negative for shortness of breath and wheezing.   Cardiovascular:  Negative for chest pain and leg swelling.  Musculoskeletal:  Positive for arthralgias, back pain and myalgias. Negative for gait problem.  Skin:  Negative for rash.  All other systems reviewed and are negative.   Per HPI unless specifically indicated above   Allergies as of 06/07/2023   No Known Allergies      Medication List        Accurate as of Jun 07, 2023 12:13 PM. If you have any questions, ask your nurse or doctor.          STOP taking these medications    HYDROcodone -acetaminophen   5-325 MG tablet Commonly known as: Norco Stopped by: Lucio Sabin Josue Falconi       TAKE these medications    atorvastatin  20 MG tablet Commonly known as: LIPITOR Take 1 tablet (20 mg total) by mouth daily.   diclofenac  Sodium 1 % Gel Commonly known as: Voltaren  Apply 2 g topically 4 (four) times daily.   docusate sodium  100 MG capsule Commonly known as: Colace Take 1 capsule (100 mg total) by mouth 2 (two) times daily as needed for mild constipation.   glucosamine-chondroitin 500-400 MG tablet Take 1 tablet by mouth 3 (three) times daily.   loratadine  10 MG tablet Commonly known as: CLARITIN  Take 1 tablet (10 mg total) by mouth daily. For itching/ allergy (needs spanish SIG)   metFORMIN  500 MG 24 hr tablet Commonly known as: GLUCOPHAGE -XR Take 1 tablet (500 mg total) by mouth 2 (two) times daily with a meal. Dose change   naproxen 500 MG tablet Commonly known as: Naprosyn Take 1 tablet (500 mg total) by mouth 2 (two) times daily with a meal. Toma con comida Started by: Melodie Spry A Jacarie Pate   omega-3 acid ethyl esters 1 g capsule Commonly known as: LOVAZA Take by mouth 2 (two) times daily.  pregabalin  50 MG capsule Commonly known as: LYRICA  Take 1 capsule (50 mg total) by mouth 3 (three) times daily.   triamcinolone  cream 0.1 % Commonly known as: KENALOG  APPLY  CREAM EXTERNALLY TWICE DAILY FOR 10-14 DAYS MAX  PER  FLARE-UP         Objective:   BP 127/73   Pulse 90   Ht 5\' 3"  (1.6 m)   Wt 186 lb (84.4 kg)   SpO2 93%   BMI 32.95 kg/m   Wt Readings from Last 3 Encounters:  06/07/23 186 lb (84.4 kg)  01/30/23 184 lb (83.5 kg)  01/23/23 184 lb (83.5 kg)    Physical Exam Vitals and nursing note reviewed.  Constitutional:      General: He is not in acute distress.    Appearance: He is well-developed. He is not diaphoretic.  Eyes:     General: No scleral icterus.    Conjunctiva/sclera: Conjunctivae normal.  Neck:     Thyroid: No thyromegaly.   Musculoskeletal:     Lumbar back: Spasms and tenderness present. No deformity. Negative right straight leg raise test and negative left straight leg raise test.     Comments: Bilateral paraspinal lower back and tenderness near the SI joint.  Skin:    General: Skin is warm and dry.     Findings: No rash.  Neurological:     Mental Status: He is alert and oriented to person, place, and time.     Coordination: Coordination normal.  Psychiatric:        Behavior: Behavior normal.    Lumbar x-ray : Significant degenerative disc disease and arthritis and bone spurs throughout the lower spine, also since some shifting of the vertebrae between L4 and L2, anterolisthesis, await final read from radiology   Assessment & Plan:   Problem List Items Addressed This Visit   None Visit Diagnoses       Acute bilateral low back pain with bilateral sciatica    -  Primary   Relevant Medications   ketorolac  (TORADOL ) injection 60 mg (Start on 06/07/2023 12:15 PM)   naproxen (NAPROSYN) 500 MG tablet   Other Relevant Orders   DG Lumbar Spine 2-3 Views       Will give Toradol  60 mg intramuscular and give naproxen that he can use for couple weeks twice daily, recommended that he take the naproxen with food.  If not improved from there then may need to discuss therapy or further imaging Follow up plan: Return if symptoms worsen or fail to improve.  Counseling provided for all of the vaccine components Orders Placed This Encounter  Procedures   DG Lumbar Spine 2-3 Views    Jolyne Needs, MD [facility] 06/07/2023, 12:13 PM

## 2023-06-07 NOTE — Telephone Encounter (Signed)
 Yes I am okay with the switch as long as Dr. Bonnell Butcher is

## 2023-06-19 NOTE — Telephone Encounter (Signed)
 LMTCB to schedule appt with Dr. Louanne Skye

## 2023-06-19 NOTE — Telephone Encounter (Signed)
 Of course this is ok.  He's a great patient and will do well under Dr D's care.

## 2023-07-03 ENCOUNTER — Encounter: Payer: Self-pay | Admitting: Family Medicine

## 2023-07-03 ENCOUNTER — Ambulatory Visit (INDEPENDENT_AMBULATORY_CARE_PROVIDER_SITE_OTHER): Admitting: Family Medicine

## 2023-07-03 VITALS — BP 146/78 | HR 69 | Temp 99.1°F | Ht 64.0 in | Wt 184.6 lb

## 2023-07-03 DIAGNOSIS — M51369 Other intervertebral disc degeneration, lumbar region without mention of lumbar back pain or lower extremity pain: Secondary | ICD-10-CM | POA: Insufficient documentation

## 2023-07-03 DIAGNOSIS — E785 Hyperlipidemia, unspecified: Secondary | ICD-10-CM

## 2023-07-03 DIAGNOSIS — E1169 Type 2 diabetes mellitus with other specified complication: Secondary | ICD-10-CM | POA: Diagnosis not present

## 2023-07-03 DIAGNOSIS — Z7984 Long term (current) use of oral hypoglycemic drugs: Secondary | ICD-10-CM | POA: Diagnosis not present

## 2023-07-03 DIAGNOSIS — M51362 Other intervertebral disc degeneration, lumbar region with discogenic back pain and lower extremity pain: Secondary | ICD-10-CM | POA: Diagnosis not present

## 2023-07-03 LAB — BAYER DCA HB A1C WAIVED: HB A1C (BAYER DCA - WAIVED): 6.7 % — ABNORMAL HIGH (ref 4.8–5.6)

## 2023-07-03 NOTE — Progress Notes (Signed)
 BP (!) 146/78   Pulse 69   Temp 99.1 F (37.3 C)   Ht 5\' 4"  (1.626 m)   Wt 184 lb 9.6 oz (83.7 kg)   SpO2 90%   BMI 31.69 kg/m    Subjective:   Patient ID: Tom [name], male    DOB: 1945-06-21, 78 y.o.   MRN: [id number]  HPI: Tom [name] is a 78 y.o. male presenting on 07/03/2023 for Establish Care (Patient states the last time he came in her talked about his back pain, they gave him a shot for the pain it helped for a few days, the pills helped for awhile but is having pain again. Patient states without the ointment ([employer] working hands)his hands and feet are very dry, they crack and get crusty.)   HPI Type 2 diabetes mellitus Patient comes in today for recheck of his diabetes. Patient has been currently taking metformin . Patient is not currently on an ACE inhibitor/ARB. Patient has not seen an ophthalmologist this year. Patient denies any new issues with their feet. The symptom started onset as an adult hyperlipidemia ARE RELATED TO DM   Hyperlipidemia Patient is coming in for recheck of his hyperlipidemia. The patient is currently taking atorvastatin  and fish oils. They deny any issues with myalgias or history of liver damage from it. They deny any focal numbness or weakness or chest pain.   Patient's biggest complaint today is his back, hurts in bilateral lower back, worse on the right than the left and that it hurts down both of his legs, sometimes on the lateral aspect and sometimes in the front.  He says this has been bothering him for at least 6 months and he did try a steroid injection and tried some oral medications and they helped a little bit but not sufficiently.  He says it hurts more when he is up on his feet moving around.  Relevant past medical, surgical, family and social history reviewed and updated as indicated. Interim medical history since our last visit reviewed. Allergies and medications reviewed and updated.  Review of Systems  Constitutional:   Negative for chills and fever.  Eyes:  Negative for discharge.  Respiratory:  Negative for shortness of breath and wheezing.   Cardiovascular:  Negative for chest pain and leg swelling.  Musculoskeletal:  Positive for arthralgias, back pain and myalgias. Negative for gait problem.  Skin:  Negative for rash.  Neurological:  Negative for weakness.  All other systems reviewed and are negative.   Per HPI unless specifically indicated above   Allergies as of 07/03/2023   No Known Allergies      Medication List        Accurate as of July 03, 2023  9:38 AM. If you have any questions, ask your nurse or doctor.          atorvastatin  20 MG tablet Commonly known as: LIPITOR Take 1 tablet (20 mg total) by mouth daily.   diclofenac  Sodium 1 % Gel Commonly known as: Voltaren  Apply 2 g topically 4 (four) times daily.   docusate sodium  100 MG capsule Commonly known as: Colace Take 1 capsule (100 mg total) by mouth 2 (two) times daily as needed for mild constipation.   glucosamine-chondroitin 500-400 MG tablet Take 1 tablet by mouth 3 (three) times daily.   loratadine  10 MG tablet Commonly known as: CLARITIN  Take 1 tablet (10 mg total) by mouth daily. For itching/ allergy (needs spanish SIG)   metFORMIN  500 MG 24 hr tablet  Commonly known as: GLUCOPHAGE -XR Take 1 tablet (500 mg total) by mouth 2 (two) times daily with a meal. Dose change   naproxen  500 MG tablet Commonly known as: Naprosyn  Take 1 tablet (500 mg total) by mouth 2 (two) times daily with a meal. Toma con comida   omega-3 acid ethyl esters 1 g capsule Commonly known as: LOVAZA Take by mouth 2 (two) times daily.   pregabalin  50 MG capsule Commonly known as: LYRICA  Take 1 capsule (50 mg total) by mouth 3 (three) times daily.   triamcinolone  cream 0.1 % Commonly known as: KENALOG  APPLY  CREAM EXTERNALLY TWICE DAILY FOR 10-14 DAYS MAX  PER  FLARE-UP         Objective:   BP (!) 146/78   Pulse 69   Temp  99.1 F (37.3 C)   Ht 5\' 4"  (1.626 m)   Wt 184 lb 9.6 oz (83.7 kg)   SpO2 90%   BMI 31.69 kg/m   Wt Readings from Last 3 Encounters:  07/03/23 184 lb 9.6 oz (83.7 kg)  06/07/23 186 lb (84.4 kg)  01/30/23 184 lb (83.5 kg)    Physical Exam Vitals and nursing note reviewed.  Constitutional:      General: He is not in acute distress.    Appearance: He is well-developed. He is not diaphoretic.  Eyes:     General: No scleral icterus.    Conjunctiva/sclera: Conjunctivae normal.  Neck:     Thyroid: No thyromegaly.  Cardiovascular:     Rate and Rhythm: Normal rate and regular rhythm.     Heart sounds: Normal heart sounds. No murmur heard. Pulmonary:     Effort: Pulmonary effort is normal. No respiratory distress.     Breath sounds: Normal breath sounds. No wheezing.  Musculoskeletal:        General: Tenderness present. No swelling. Normal range of motion.     Cervical back: Neck supple.     Lumbar back: Spasms present. No tenderness or bony tenderness. Negative right straight leg raise test and negative left straight leg raise test.     Comments: Bilateral lower back pain with range of motion.  Negative SLR  Lymphadenopathy:     Cervical: No cervical adenopathy.  Skin:    General: Skin is warm and dry.     Findings: No rash.  Neurological:     Mental Status: He is alert and oriented to person, place, and time.     Coordination: Coordination normal.  Psychiatric:        Behavior: Behavior normal.       Assessment & Plan:   Problem List Items Addressed This Visit       Endocrine   Hyperlipidemia associated with type 2 diabetes mellitus (HCC)   Relevant Orders   Lipid panel   Type 2 diabetes mellitus with other specified complication (HCC) - Primary   Relevant Orders   Bayer DCA Hb A1c Waived   CBC with Differential/Platelet   CMP14+EGFR   Vitamin B12   Microalbumin / creatinine urine ratio     Musculoskeletal and Integument   Degenerative disc disease, lumbar    Relevant Orders   Ambulatory referral to Orthopedic Surgery    Continue current medications, will refer to orthopedic for possible spinal injection.   Follow up plan: Return in about 3 months (around 10/03/2023), or if symptoms worsen or fail to improve, for Diabetes and hyperlipidemia.  Counseling provided for all of the vaccine components Orders Placed This Encounter  Procedures  Bayer DCA Hb A1c Waived   CBC with Differential/Platelet   CMP14+EGFR   Vitamin B12   Microalbumin / creatinine urine ratio   Lipid panel   Ambulatory referral to Orthopedic Surgery    Jolyne Needs, MD [facility] 07/03/2023, 9:38 AM

## 2023-07-04 LAB — LIPID PANEL
Chol/HDL Ratio: 4 ratio (ref 0.0–5.0)
Cholesterol, Total: 131 mg/dL (ref 100–199)
HDL: 33 mg/dL — ABNORMAL LOW (ref >39)
LDL Chol Calc (NIH): 72 mg/dL (ref 0–99)
Triglycerides: 150 mg/dL — ABNORMAL HIGH (ref 0–149)
VLDL Cholesterol Cal: 26 mg/dL (ref 5–40)

## 2023-07-04 LAB — CMP14+EGFR
ALT: 30 IU/L (ref 0–44)
AST: 24 IU/L (ref 0–40)
Albumin: 4.7 g/dL (ref 3.8–4.8)
Alkaline Phosphatase: 104 IU/L (ref 44–121)
BUN/Creatinine Ratio: 13 (ref 10–24)
BUN: 11 mg/dL (ref 8–27)
Bilirubin Total: 0.5 mg/dL (ref 0.0–1.2)
CO2: 22 mmol/L (ref 20–29)
Calcium: 9.3 mg/dL (ref 8.6–10.2)
Chloride: 103 mmol/L (ref 96–106)
Creatinine, Ser: 0.82 mg/dL (ref 0.76–1.27)
Globulin, Total: 2.5 g/dL (ref 1.5–4.5)
Glucose: 136 mg/dL — ABNORMAL HIGH (ref 70–99)
Potassium: 4.4 mmol/L (ref 3.5–5.2)
Sodium: 142 mmol/L (ref 134–144)
Total Protein: 7.2 g/dL (ref 6.0–8.5)
eGFR: 92 mL/min/{1.73_m2} (ref >59)

## 2023-07-04 LAB — CBC WITH DIFFERENTIAL/PLATELET
Basophils Absolute: 0.1 10*3/uL (ref 0.0–0.2)
Basos: 1 %
EOS (ABSOLUTE): 0.5 10*3/uL — ABNORMAL HIGH (ref 0.0–0.4)
Eos: 7 %
Hematocrit: 45.6 % (ref 37.5–51.0)
Hemoglobin: 14.8 g/dL (ref 13.0–17.7)
Immature Grans (Abs): 0.1 10*3/uL (ref 0.0–0.1)
Immature Granulocytes: 1 %
Lymphocytes Absolute: 2.4 10*3/uL (ref 0.7–3.1)
Lymphs: 36 %
MCH: 28.4 pg (ref 26.6–33.0)
MCHC: 32.5 g/dL (ref 31.5–35.7)
MCV: 88 fL (ref 79–97)
Monocytes Absolute: 0.7 10*3/uL (ref 0.1–0.9)
Monocytes: 10 %
Neutrophils Absolute: 3 10*3/uL (ref 1.4–7.0)
Neutrophils: 45 %
Platelets: 253 10*3/uL (ref 150–450)
RBC: 5.21 x10E6/uL (ref 4.14–5.80)
RDW: 14 % (ref 11.6–15.4)
WBC: 6.6 10*3/uL (ref 3.4–10.8)

## 2023-07-04 LAB — VITAMIN B12: Vitamin B-12: 630 pg/mL (ref 232–1245)

## 2023-07-04 LAB — MICROALBUMIN / CREATININE URINE RATIO
Creatinine, Urine: 111.5 mg/dL
Microalb/Creat Ratio: 109 mg/g{creat} — ABNORMAL HIGH (ref 0–29)
Microalbumin, Urine: 121.7 ug/mL

## 2023-07-10 ENCOUNTER — Ambulatory Visit: Payer: Self-pay | Admitting: Family Medicine

## 2023-08-07 ENCOUNTER — Ambulatory Visit: Payer: BC Managed Care – PPO | Admitting: Family Medicine

## 2023-09-13 ENCOUNTER — Other Ambulatory Visit: Payer: Self-pay

## 2023-09-13 ENCOUNTER — Ambulatory Visit: Payer: Self-pay | Admitting: Orthopedic Surgery

## 2023-09-13 ENCOUNTER — Ambulatory Visit: Admitting: Orthopedic Surgery

## 2023-09-13 DIAGNOSIS — M5416 Radiculopathy, lumbar region: Secondary | ICD-10-CM | POA: Diagnosis not present

## 2023-09-13 DIAGNOSIS — M545 Low back pain, unspecified: Secondary | ICD-10-CM

## 2023-09-13 MED ORDER — PREGABALIN 75 MG PO CAPS
75.0000 mg | ORAL_CAPSULE | Freq: Two times a day (BID) | ORAL | 1 refills | Status: AC
Start: 2023-09-13 — End: 2023-11-12

## 2023-09-13 NOTE — Progress Notes (Signed)
 Orthopedic Spine Surgery Office Note   Assessment: Patient is a 78 y.o. male with low back pain that radiates into the bilateral lower extremities along the posterior lateral aspect. Has bilateral foraminal stenosis at L4/5 and L5/S1. Also has lateral recess stenosis at L4/5.      Plan: -Patient has tried Tylenol  -Patient has not tried any nonoperative treatment recently, so discussed options for him.  After our discussion, he wanted to try Lyrica  and a lumbar steroid injection.  Lyrica  sent in for him and referral provided for injection -Previous plan was for a L4-S1 ALIF and PSIF for indirect decompression.  Symptoms remain refractory to conservative treatments, would consider offering that again as a treatment -Patient should return to the office in 6 weeks, x-rays at next visit: None   ___________________________________________________________________________   History: Patient is a 78 y.o. male who comes back again with similar symptoms that he had when I saw him last in the office.  He is having back pain that radiates down the lateral aspect of his thighs and legs to the level of the ankles.  He notes that on a daily basis.  He feels it with activity and at rest.  He frequently find himself changing position in order to try to help with the pain.  He has not noticed anything recently that been helping with the pain.  He does not recall going over the surgery or the plan for surgery in the past.  He has not had the pain for about 2 years.  At its worst, the pain is an 8 out of 10.   Previous treatments: Tylenol     Physical Exam:   General: no acute distress, appears stated age Neurologic: alert, answering questions appropriately, following commands Respiratory: unlabored breathing on room air, symmetric chest rise Psychiatric: appropriate affect, normal cadence to speech     MSK (spine):   -Strength exam                                                   Left                   Right EHL                              4/5                  4/5 TA                                 5/5                  5/5 GSC                             5/5                  5/5 Knee extension            5/5                  5/5 Hip flexion                    5/5  5/5   -Sensory exam                           Sensation intact to light touch in L3-S1 nerve distributions of bilateral lower extremities   Imaging: XR of the lumbar spine from 03/22/2022 and 04/18/2022 was previously independently reviewed and interpreted, showing disc height loss at L4/5 and L5/S1.  There is spondylolisthesis at L5/S1 that shifts about 2 mm between flexion and extension.  No fracture or dislocation seen.  Lumbar scoliosis with apex to the left at L4.    MRI of the lumbar spine from 06/05/2022 was previously independently reviewed and interpreted, showing bilateral foraminal stenosis at L4/5 and L5/S1. Lateral recess stenosis at L4/5. There is a spondylolisthesis at L5/S1. DDD at L4/5 and L5/S1.     Patient name: Tom [name] Patient MRN: [id number] Date of visit: 09/13/23

## 2023-10-04 ENCOUNTER — Encounter: Payer: Self-pay | Admitting: Family Medicine

## 2023-10-04 ENCOUNTER — Ambulatory Visit (INDEPENDENT_AMBULATORY_CARE_PROVIDER_SITE_OTHER): Admitting: Family Medicine

## 2023-10-04 VITALS — BP 126/74 | HR 69 | Ht 64.0 in | Wt 182.0 lb

## 2023-10-04 DIAGNOSIS — Z23 Encounter for immunization: Secondary | ICD-10-CM

## 2023-10-04 DIAGNOSIS — L309 Dermatitis, unspecified: Secondary | ICD-10-CM

## 2023-10-04 DIAGNOSIS — M51362 Other intervertebral disc degeneration, lumbar region with discogenic back pain and lower extremity pain: Secondary | ICD-10-CM

## 2023-10-04 DIAGNOSIS — Z7984 Long term (current) use of oral hypoglycemic drugs: Secondary | ICD-10-CM

## 2023-10-04 DIAGNOSIS — E785 Hyperlipidemia, unspecified: Secondary | ICD-10-CM

## 2023-10-04 DIAGNOSIS — E1169 Type 2 diabetes mellitus with other specified complication: Secondary | ICD-10-CM | POA: Diagnosis not present

## 2023-10-04 LAB — BAYER DCA HB A1C WAIVED: HB A1C (BAYER DCA - WAIVED): 6.8 % — ABNORMAL HIGH (ref 4.8–5.6)

## 2023-10-04 MED ORDER — METFORMIN HCL ER 500 MG PO TB24: 500.0000 mg | ORAL_TABLET | Freq: Two times a day (BID) | ORAL | 3 refills | Status: AC

## 2023-10-04 MED ORDER — LORATADINE 10 MG PO TABS: 10.0000 mg | ORAL_TABLET | Freq: Every day | ORAL | 3 refills | Status: AC

## 2023-10-04 MED ORDER — ATORVASTATIN CALCIUM 20 MG PO TABS
20.0000 mg | ORAL_TABLET | Freq: Every day | ORAL | 3 refills | Status: AC
Start: 2023-10-04 — End: ?

## 2023-10-04 MED ORDER — DOCUSATE SODIUM 100 MG PO CAPS: 100.0000 mg | ORAL_CAPSULE | Freq: Two times a day (BID) | ORAL | 99 refills | Status: AC | PRN
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
## 2023-10-19 ENCOUNTER — Ambulatory Visit: Admitting: Orthopedic Surgery

## 2023-10-24 ENCOUNTER — Other Ambulatory Visit: Payer: Self-pay

## 2023-10-24 ENCOUNTER — Ambulatory Visit: Admitting: Physical Medicine and Rehabilitation

## 2023-10-24 VITALS — BP 151/81 | HR 61

## 2023-10-24 DIAGNOSIS — M5416 Radiculopathy, lumbar region: Secondary | ICD-10-CM

## 2023-10-24 MED ORDER — METHYLPREDNISOLONE ACETATE 80 MG/ML IJ SUSP
40.0000 mg | Freq: Once | INTRAMUSCULAR | Status: AC
  Administered 2023-10-24: 40 mg

## 2023-10-24 NOTE — Progress Notes (Signed)
 Tom [name] - 78 y.o. male MRN [id number]  Date of birth: [birth year]  Office Visit Note: Visit Date: 10/24/2023 PCP: Dettinger, Fonda LABOR, MD Referred by: Georgina Ozell LABOR, MD  Subjective: Chief Complaint  Patient presents with   Lower Back - Pain   HPI:  Tom [name] is a 78 y.o. male who comes in today at the request of Dr. Ozell Georgina for planned Midline L5-S1 Lumbar Interlaminar epidural steroid injection with fluoroscopic guidance.  The patient has failed conservative care including home exercise, medications, time and activity modification.  This injection will be diagnostic and hopefully therapeutic.  Please see requesting physician notes for further details and justification.   ROS Otherwise per HPI.  Assessment & Plan: Visit Diagnoses:    ICD-10-CM   1. Lumbar radiculopathy  M54.16 XR C-ARM NO REPORT    Epidural Steroid injection    methylPREDNISolone  acetate (DEPO-MEDROL ) injection 40 mg      Plan: No additional findings.   Meds & Orders:  Meds ordered this encounter  Medications   methylPREDNISolone  acetate (DEPO-MEDROL ) injection 40 mg    Orders Placed This Encounter  Procedures   XR C-ARM NO REPORT   Epidural Steroid injection    Follow-up: Return for visit to requesting provider as needed.   Procedures: No procedures performed  Lumbar Epidural Steroid Injection - Interlaminar Approach with Fluoroscopic Guidance  Patient: Tom [name]      Date of Birth: Apr 16, 1945 MRN: [id number] PCP: Dettinger, Fonda LABOR, MD      Visit Date: 10/24/2023   Universal Protocol:     Consent Given By: the patient  Position: PRONE  Additional Comments: Vital signs were monitored before and after the procedure. Patient was prepped and draped in the usual sterile fashion. The correct patient, procedure, and site was verified.   Injection Procedure Details:   Procedure diagnoses: Lumbar radiculopathy [M54.16]   Meds Administered:  Meds ordered this  encounter  Medications   methylPREDNISolone  acetate (DEPO-MEDROL ) injection 40 mg     Laterality: Midline  Location/Site:  L5-S1  Needle: 3.5 in., 20 ga. Tuohy  Needle Placement: Paramedian epidural  Findings:   -Comments: Excellent flow of contrast into the epidural space.  Procedure Details: Using a paramedian approach from the side mentioned above, the region overlying the inferior lamina was localized under fluoroscopic visualization and the soft tissues overlying this structure were infiltrated with 4 ml. of 1% Lidocaine without Epinephrine. The Tuohy needle was inserted into the epidural space using a paramedian approach.   The epidural space was localized using loss of resistance along with counter oblique bi-planar fluoroscopic views.  After negative aspirate for air, blood, and CSF, a 2 ml. volume of Isovue-250 was injected into the epidural space and the flow of contrast was observed. Radiographs were obtained for documentation purposes.    The injectate was administered into the level noted above.   Additional Comments:  The patient tolerated the procedure well Dressing: 2 x 2 sterile gauze and Band-Aid    Post-procedure details: Patient was observed during the procedure. Post-procedure instructions were reviewed.  Patient left the clinic in stable condition.   Clinical History: CLINICAL DATA:  Lumbar radiculopathy with symptoms persisting over 6 weeks of treatment. Pain radiates to the bilateral lower extremity for 1 year   EXAM: MRI LUMBAR SPINE WITHOUT CONTRAST   TECHNIQUE: Multiplanar, multisequence MR imaging of the lumbar spine was performed. No intravenous contrast was administered.   COMPARISON:  None Available.   FINDINGS: Segmentation:  Standard.  Alignment:  Mild scoliosis.  7 mm of L5-S1 anterolisthesis.   Vertebrae:  No fracture, evidence of discitis, or bone lesion.   Conus medullaris and cauda equina: Conus extends to the  T12-L1 level. Conus and cauda equina appear normal.   Paraspinal and other soft tissues: Negative for perispinal mass or inflammation   Disc levels:   T10-11: Disc collapse and endplate degeneration with mild ridging. No neural compression.   T12- L1: Ventral spondylitic spurring   L1-L2: Disc narrowing and bulging with bilateral inferior foraminal protrusion.   L2-L3: Disc narrowing and circumferential bulging. Small inferior foraminal protrusions.   L3-L4: Disc narrowing and bulging with right inferior foraminal protrusion. Facet spurring greater on the right. Mild-to-moderate right foraminal narrowing.   L4-L5: Disc narrowing and endplate degeneration with right paracentral to foraminal herniation. Facet spurring on both sides. Advanced right foraminal stenosis. Right subarticular recess narrowing that could affect the right L5 nerve root   L5-S1:Chronic pars defects at the L5 level. The disc is narrowed and bulging with upward pointing central herniation. Biforaminal impingement with L5 root flattening. Mild thecal sac stenosis.   IMPRESSION: 1. Advanced and generalized lumbar spine degeneration. L5-S1 anterolisthesis related to chronic L5 pars defects. 2. Advanced foraminal impingement on the right at L4-5 and bilaterally at L5-S1. 3. L4-5 mild to moderate right subarticular recess stenosis.     Electronically Signed   By: Dorn Roulette M.D.   On: 06/10/2022 05:11     Objective:  VS:  HT:    WT:   BMI:     BP:(!) 151/81  HR:61bpm  TEMP: ( )  RESP:  Physical Exam Vitals and nursing note reviewed.  Constitutional:      General: He is not in acute distress.    Appearance: Normal appearance. He is not ill-appearing.  HENT:     Head: Normocephalic and atraumatic.     Right Ear: External ear normal.     Left Ear: External ear normal.     Nose: No congestion.  Eyes:     Extraocular Movements: Extraocular movements intact.  Cardiovascular:     Rate and  Rhythm: Normal rate.     Pulses: Normal pulses.  Pulmonary:     Effort: Pulmonary effort is normal. No respiratory distress.  Abdominal:     General: There is no distension.     Palpations: Abdomen is soft.  Musculoskeletal:        General: No tenderness or signs of injury.     Cervical back: Neck supple.     Right lower leg: No edema.     Left lower leg: No edema.     Comments: Patient has good distal strength without clonus.  Skin:    Findings: No erythema or rash.  Neurological:     General: No focal deficit present.     Mental Status: He is alert and oriented to person, place, and time.     Sensory: No sensory deficit.     Motor: No weakness or abnormal muscle tone.     Coordination: Coordination normal.  Psychiatric:        Mood and Affect: Mood normal.        Behavior: Behavior normal.      Imaging: No results found.

## 2023-10-24 NOTE — Progress Notes (Signed)
 Pain Scale   Average Pain 6 Patient advising he has lower back pain radiating bilaterally to legs pain is constant        +Driver, -BT, -Dye Allergies.

## 2023-10-24 NOTE — Procedures (Signed)
 Lumbar Epidural Steroid Injection - Interlaminar Approach with Fluoroscopic Guidance  Patient: Tom [name]      Date of Birth: September 03, 1945 MRN: [id number] PCP: Dettinger, Fonda LABOR, MD      Visit Date: 10/24/2023   Universal Protocol:     Consent Given By: the patient  Position: PRONE  Additional Comments: Vital signs were monitored before and after the procedure. Patient was prepped and draped in the usual sterile fashion. The correct patient, procedure, and site was verified.   Injection Procedure Details:   Procedure diagnoses: Lumbar radiculopathy [M54.16]   Meds Administered:  Meds ordered this encounter  Medications   methylPREDNISolone  acetate (DEPO-MEDROL ) injection 40 mg     Laterality: Midline  Location/Site:  L5-S1  Needle: 3.5 in., 20 ga. Tuohy  Needle Placement: Paramedian epidural  Findings:   -Comments: Excellent flow of contrast into the epidural space.  Procedure Details: Using a paramedian approach from the side mentioned above, the region overlying the inferior lamina was localized under fluoroscopic visualization and the soft tissues overlying this structure were infiltrated with 4 ml. of 1% Lidocaine without Epinephrine. The Tuohy needle was inserted into the epidural space using a paramedian approach.   The epidural space was localized using loss of resistance along with counter oblique bi-planar fluoroscopic views.  After negative aspirate for air, blood, and CSF, a 2 ml. volume of Isovue-250 was injected into the epidural space and the flow of contrast was observed. Radiographs were obtained for documentation purposes.    The injectate was administered into the level noted above.   Additional Comments:  The patient tolerated the procedure well Dressing: 2 x 2 sterile gauze and Band-Aid    Post-procedure details: Patient was observed during the procedure. Post-procedure instructions were reviewed.  Patient left the clinic in stable  condition.

## 2023-10-25 ENCOUNTER — Ambulatory Visit: Admitting: Orthopedic Surgery

## 2023-10-25 DIAGNOSIS — M5416 Radiculopathy, lumbar region: Secondary | ICD-10-CM

## 2023-10-25 NOTE — Progress Notes (Signed)
 Orthopedic Spine Surgery Office Note   Assessment: Patient is a 78 y.o. male with low back pain that radiates into the bilateral lower extremities along the posterior lateral aspect. Has bilateral foraminal stenosis at L4/5 and L5/S1. Also has lateral recess stenosis at L4/5.      Plan: -Patient has tried Tylenol , Lyrica , lumbar steroid injection - Since he is now doing well and pain is manageable with Lyrica  and injection.  Told him that we can do periodic injections as long as they are providing him with lasting relief -If pain becomes refractory to conservative treatments, would discuss L4-S1 ALIF and PSIF for indirect decompression as an option -Patient should return to the office on an as-needed basis   ___________________________________________________________________________   History: Patient is a 78 y.o. male who is presenting for follow-up on his lumbar spine.  Patient got an injection with Dr. Eldonna since he was last seen in the office.  He said he got about 80% relief with that injection so far.  He has been having pain in his back and radiate into the lateral aspect of his thighs and legs on both sides.  This pain is now tolerable after the injection.  He has been using Lyrica  as well and has found that helpful but not nearly as helpful as the injection.  He has not developed any new symptoms since he was last seen in the office.   Previous treatments: Tylenol , Lyrica , lumbar steroid injection     Physical Exam:   General: no acute distress, appears stated age Neurologic: alert, answering questions appropriately, following commands Respiratory: unlabored breathing on room air, symmetric chest rise Psychiatric: appropriate affect, normal cadence to speech     MSK (spine):   -Strength exam                                                   Left                  Right EHL                              4/5                  4/5 TA                                 5/5                   5/5 GSC                             5/5                  5/5 Knee extension            5/5                  5/5 Hip flexion                    5/5                  5/5   -Sensory exam  Sensation intact to light touch in L3-S1 nerve distributions of bilateral lower extremities   Imaging: XR of the lumbar spine from 03/22/2022 and 04/18/2022 were previously independently reviewed and interpreted, showing disc height loss at L4/5 and L5/S1.  There is spondylolisthesis at L5/S1 that shifts about 2 mm between flexion and extension.  No fracture or dislocation seen.  Lumbar scoliosis with apex to the left at L4.    MRI of the lumbar spine from 06/05/2022 was previously independently reviewed and interpreted, showing bilateral foraminal stenosis at L4/5 and L5/S1. Lateral recess stenosis at L4/5. There is a spondylolisthesis at L5/S1. DDD at L4/5 and L5/S1.      Patient name: Tom [name] Patient MRN: [id number] Date of visit: 10/25/23

## 2023-10-31 ENCOUNTER — Encounter: Admitting: Physical Medicine and Rehabilitation

## 2024-01-08 ENCOUNTER — Encounter: Payer: Self-pay | Admitting: Family Medicine

## 2024-02-22 ENCOUNTER — Encounter: Payer: Self-pay | Admitting: Family Medicine

## 2024-02-22 ENCOUNTER — Ambulatory Visit: Admitting: Family Medicine

## 2024-02-22 VITALS — BP 142/74 | HR 74 | Ht 64.0 in | Wt 184.0 lb

## 2024-02-22 DIAGNOSIS — Z0001 Encounter for general adult medical examination with abnormal findings: Secondary | ICD-10-CM | POA: Diagnosis not present

## 2024-02-22 DIAGNOSIS — Z0000 Encounter for general adult medical examination without abnormal findings: Secondary | ICD-10-CM

## 2024-02-22 DIAGNOSIS — R0989 Other specified symptoms and signs involving the circulatory and respiratory systems: Secondary | ICD-10-CM | POA: Diagnosis not present

## 2024-02-22 DIAGNOSIS — Z9189 Other specified personal risk factors, not elsewhere classified: Secondary | ICD-10-CM
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
## 2024-04-22 ENCOUNTER — Ambulatory Visit: Admitting: Family Medicine
